# Patient Record
Sex: Female | Born: 1935 | Race: Black or African American | Hispanic: No
Health system: Southern US, Community
[De-identification: ages and names within clinical notes are randomized; demographics above are authoritative.]

## PROBLEM LIST (undated history)

## (undated) DIAGNOSIS — I6990 Unspecified sequelae of unspecified cerebrovascular disease: Secondary | ICD-10-CM

## (undated) DIAGNOSIS — E785 Hyperlipidemia, unspecified: Secondary | ICD-10-CM

## (undated) DIAGNOSIS — H3530 Unspecified macular degeneration: Secondary | ICD-10-CM

## (undated) DIAGNOSIS — M129 Arthropathy, unspecified: Secondary | ICD-10-CM

## (undated) DIAGNOSIS — I10 Essential (primary) hypertension: Secondary | ICD-10-CM

## (undated) DIAGNOSIS — F419 Anxiety disorder, unspecified: Secondary | ICD-10-CM

## (undated) DIAGNOSIS — F322 Major depressive disorder, single episode, severe without psychotic features: Secondary | ICD-10-CM

## (undated) DIAGNOSIS — G309 Alzheimer's disease, unspecified: Secondary | ICD-10-CM

## (undated) DIAGNOSIS — F0280 Dementia in other diseases classified elsewhere without behavioral disturbance: Secondary | ICD-10-CM

---

## 2012-02-05 ENCOUNTER — Inpatient Hospital Stay: Payer: Self-pay | Admitting: Internal Medicine

## 2012-02-05 LAB — COMPREHENSIVE METABOLIC PANEL
Albumin: 3.1 g/dL — ABNORMAL LOW (ref 3.4–5.0)
Alkaline Phosphatase: 126 U/L (ref 50–136)
Anion Gap: 7 (ref 7–16)
BUN: 35 mg/dL — ABNORMAL HIGH (ref 7–18)
Chloride: 111 mmol/L — ABNORMAL HIGH (ref 98–107)
Co2: 29 mmol/L (ref 21–32)
Creatinine: 1.51 mg/dL — ABNORMAL HIGH (ref 0.60–1.30)
Glucose: 94 mg/dL (ref 65–99)
Osmolality: 300 (ref 275–301)
SGPT (ALT): 37 U/L
Sodium: 147 mmol/L — ABNORMAL HIGH (ref 136–145)

## 2012-02-05 LAB — URINALYSIS, COMPLETE
Glucose,UR: NEGATIVE mg/dL (ref 0–75)
Ketone: NEGATIVE
Nitrite: NEGATIVE
RBC,UR: 2 /HPF (ref 0–5)
Squamous Epithelial: 5
WBC UR: 44 /HPF (ref 0–5)

## 2012-02-05 LAB — CBC
HCT: 34.8 % — ABNORMAL LOW (ref 35.0–47.0)
MCHC: 32.7 g/dL (ref 32.0–36.0)
MCV: 103 fL — ABNORMAL HIGH (ref 80–100)
Platelet: 187 10*3/uL (ref 150–440)
RBC: 3.38 10*6/uL — ABNORMAL LOW (ref 3.80–5.20)
WBC: 4.1 10*3/uL (ref 3.6–11.0)

## 2012-02-05 LAB — TROPONIN I: Troponin-I: <0.02 ng/mL

## 2012-02-05 LAB — CK TOTAL AND CKMB (NOT AT ARMC)
CK, Total: 43 U/L (ref 21–215)
CK-MB: 0.9 ng/mL (ref 0.5–3.6)

## 2012-02-06 LAB — MAGNESIUM: Magnesium: 2.1 mg/dL

## 2012-02-06 LAB — BASIC METABOLIC PANEL
Anion Gap: 9 (ref 7–16)
BUN: 25 mg/dL — ABNORMAL HIGH (ref 7–18)
Calcium, Total: 8.3 mg/dL — ABNORMAL LOW (ref 8.5–10.1)
Chloride: 112 mmol/L — ABNORMAL HIGH (ref 98–107)
Creatinine: 0.95 mg/dL (ref 0.60–1.30)
EGFR (African American): >60
EGFR (Non-African Amer.): 52 — ABNORMAL LOW
Glucose: 78 mg/dL (ref 65–99)
Potassium: 3.6 mmol/L (ref 3.5–5.1)
Sodium: 146 mmol/L — ABNORMAL HIGH (ref 136–145)

## 2012-02-06 LAB — LIPID PANEL
Ldl Cholesterol, Calc: 96 mg/dL (ref 0–100)
VLDL Cholesterol, Calc: 23 mg/dL (ref 5–40)

## 2012-02-07 LAB — URINE CULTURE

## 2012-02-11 LAB — CULTURE, BLOOD (SINGLE)

## 2012-04-22 ENCOUNTER — Inpatient Hospital Stay: Payer: Self-pay | Admitting: Internal Medicine

## 2012-04-22 LAB — PROTIME-INR
INR: 1
Prothrombin Time: 13.9 secs (ref 11.5–14.7)

## 2012-04-22 LAB — URINALYSIS, COMPLETE
Glucose,UR: NEGATIVE mg/dL (ref 0–75)
Ketone: NEGATIVE
Leukocyte Esterase: NEGATIVE
Nitrite: NEGATIVE
Ph: 5 (ref 4.5–8.0)
RBC,UR: 1 /HPF (ref 0–5)
Squamous Epithelial: 1
WBC UR: 1 /HPF (ref 0–5)

## 2012-04-22 LAB — CBC
HCT: 28.7 % — ABNORMAL LOW (ref 35.0–47.0)
HGB: 9.7 g/dL — ABNORMAL LOW (ref 12.0–16.0)
MCH: 34.6 pg — ABNORMAL HIGH (ref 26.0–34.0)
Platelet: 136 10*3/uL — ABNORMAL LOW (ref 150–440)
RDW: 13.3 % (ref 11.5–14.5)
WBC: 10.1 10*3/uL (ref 3.6–11.0)

## 2012-04-22 LAB — COMPREHENSIVE METABOLIC PANEL
Albumin: 2.7 g/dL — ABNORMAL LOW (ref 3.4–5.0)
Alkaline Phosphatase: 97 U/L (ref 50–136)
BUN: 17 mg/dL (ref 7–18)
Calcium, Total: 7.2 mg/dL — ABNORMAL LOW (ref 8.5–10.1)
Co2: 23 mmol/L (ref 21–32)
EGFR (African American): >60
EGFR (Non-African Amer.): 54 — ABNORMAL LOW
Glucose: 108 mg/dL — ABNORMAL HIGH (ref 65–99)
Potassium: 2.9 mmol/L — ABNORMAL LOW (ref 3.5–5.1)
SGOT(AST): 17 U/L (ref 15–37)
SGPT (ALT): 20 U/L (ref 12–78)

## 2012-04-22 LAB — TROPONIN I: Troponin-I: <0.02 ng/mL

## 2012-04-22 LAB — APTT: Activated PTT: 42.2 secs — ABNORMAL HIGH (ref 23.6–35.9)

## 2012-04-23 LAB — COMPREHENSIVE METABOLIC PANEL
Albumin: 2.7 g/dL — ABNORMAL LOW (ref 3.4–5.0)
Anion Gap: 6 — ABNORMAL LOW (ref 7–16)
BUN: 20 mg/dL — ABNORMAL HIGH (ref 7–18)
Bilirubin,Total: 0.4 mg/dL (ref 0.2–1.0)
Chloride: 118 mmol/L — ABNORMAL HIGH (ref 98–107)
Creatinine: 1.17 mg/dL (ref 0.60–1.30)
EGFR (African American): 47 — ABNORMAL LOW
Glucose: 109 mg/dL — ABNORMAL HIGH (ref 65–99)
Potassium: 4.4 mmol/L (ref 3.5–5.1)
SGOT(AST): 49 U/L — ABNORMAL HIGH (ref 15–37)
SGPT (ALT): 28 U/L (ref 12–78)
Sodium: 150 mmol/L — ABNORMAL HIGH (ref 136–145)
Total Protein: 5 g/dL — ABNORMAL LOW (ref 6.4–8.2)

## 2012-04-23 LAB — CBC WITH DIFFERENTIAL/PLATELET
Eosinophil #: 0 10*3/uL (ref 0.0–0.7)
Eosinophil %: 0.2 %
HCT: 26.1 % — ABNORMAL LOW (ref 35.0–47.0)
Lymphocyte #: 1.3 10*3/uL (ref 1.0–3.6)
MCH: 34.7 pg — ABNORMAL HIGH (ref 26.0–34.0)
MCV: 102 fL — ABNORMAL HIGH (ref 80–100)
Monocyte #: 1.2 x10 3/mm — ABNORMAL HIGH (ref 0.2–0.9)
Neutrophil #: 6.4 10*3/uL (ref 1.4–6.5)
Platelet: 129 10*3/uL — ABNORMAL LOW (ref 150–440)
RBC: 2.56 10*6/uL — ABNORMAL LOW (ref 3.80–5.20)
RDW: 13.8 % (ref 11.5–14.5)
WBC: 8.9 10*3/uL (ref 3.6–11.0)

## 2012-04-24 LAB — BASIC METABOLIC PANEL
Anion Gap: 8 (ref 7–16)
BUN: 12 mg/dL (ref 7–18)
Creatinine: 0.85 mg/dL (ref 0.60–1.30)
EGFR (African American): >60
EGFR (Non-African Amer.): 60 — ABNORMAL LOW
Sodium: 144 mmol/L (ref 136–145)

## 2012-04-24 LAB — CBC WITH DIFFERENTIAL/PLATELET
Basophil %: 0.4 %
Eosinophil #: 0.1 10*3/uL (ref 0.0–0.7)
Eosinophil %: 1.4 %
HCT: 26.2 % — ABNORMAL LOW (ref 35.0–47.0)
HGB: 8.8 g/dL — ABNORMAL LOW (ref 12.0–16.0)
Lymphocyte %: 14.5 %
MCV: 102 fL — ABNORMAL HIGH (ref 80–100)
Monocyte %: 13.1 %
Neutrophil #: 5.3 10*3/uL (ref 1.4–6.5)
Neutrophil %: 70.6 %
Platelet: 123 10*3/uL — ABNORMAL LOW (ref 150–440)
RBC: 2.57 10*6/uL — ABNORMAL LOW (ref 3.80–5.20)
WBC: 7.5 10*3/uL (ref 3.6–11.0)

## 2012-05-04 ENCOUNTER — Emergency Department: Payer: Self-pay | Admitting: *Deleted

## 2013-05-27 ENCOUNTER — Emergency Department: Payer: Self-pay | Admitting: Emergency Medicine

## 2014-01-17 ENCOUNTER — Emergency Department: Payer: Self-pay | Admitting: Emergency Medicine

## 2014-01-17 LAB — CBC WITH DIFFERENTIAL/PLATELET
BASOS ABS: 0.1 10*3/uL (ref 0.0–0.1)
Basophil %: 1 %
EOS PCT: 1.4 %
Eosinophil #: 0.1 10*3/uL (ref 0.0–0.7)
HCT: 38.6 % (ref 35.0–47.0)
HGB: 12.5 g/dL (ref 12.0–16.0)
Lymphocyte #: 1.5 10*3/uL (ref 1.0–3.6)
Lymphocyte %: 27.5 %
MCH: 33.3 pg (ref 26.0–34.0)
MCHC: 32.3 g/dL (ref 32.0–36.0)
MCV: 103 fL — AB (ref 80–100)
MONOS PCT: 12.3 %
Monocyte #: 0.7 x10 3/mm (ref 0.2–0.9)
Neutrophil #: 3.2 10*3/uL (ref 1.4–6.5)
Neutrophil %: 57.8 %
PLATELETS: 186 10*3/uL (ref 150–440)
RBC: 3.74 10*6/uL — AB (ref 3.80–5.20)
RDW: 13.9 % (ref 11.5–14.5)
WBC: 5.6 10*3/uL (ref 3.6–11.0)

## 2014-01-18 LAB — COMPREHENSIVE METABOLIC PANEL
ALK PHOS: 92 U/L
AST: 31 U/L (ref 15–37)
Albumin: 3.5 g/dL (ref 3.4–5.0)
Anion Gap: 5 — ABNORMAL LOW (ref 7–16)
BUN: 16 mg/dL (ref 7–18)
Bilirubin,Total: 0.3 mg/dL (ref 0.2–1.0)
CALCIUM: 8.8 mg/dL (ref 8.5–10.1)
CHLORIDE: 107 mmol/L (ref 98–107)
CREATININE: 0.97 mg/dL (ref 0.60–1.30)
Co2: 27 mmol/L (ref 21–32)
GFR CALC AF AMER: 58 — AB
GFR CALC NON AF AMER: 50 — AB
Glucose: 107 mg/dL — ABNORMAL HIGH (ref 65–99)
OSMOLALITY: 279 (ref 275–301)
Potassium: 3.9 mmol/L (ref 3.5–5.1)
SGPT (ALT): 19 U/L (ref 12–78)
Sodium: 139 mmol/L (ref 136–145)
Total Protein: 6.7 g/dL (ref 6.4–8.2)

## 2014-01-18 LAB — URINALYSIS, COMPLETE
BACTERIA: NONE SEEN
Bilirubin,UR: NEGATIVE
Glucose,UR: NEGATIVE mg/dL (ref 0–75)
Ketone: NEGATIVE
Nitrite: NEGATIVE
Ph: 7 (ref 4.5–8.0)
Protein: NEGATIVE
Specific Gravity: 1.011 (ref 1.003–1.030)
Squamous Epithelial: <1
WBC UR: 51 /HPF (ref 0–5)

## 2014-01-18 LAB — TROPONIN I

## 2014-01-18 LAB — TSH: Thyroid Stimulating Horm: 2.74 u[IU]/mL

## 2014-01-22 ENCOUNTER — Observation Stay: Payer: Self-pay | Admitting: Internal Medicine

## 2014-01-22 LAB — COMPREHENSIVE METABOLIC PANEL
ALBUMIN: 3.1 g/dL — AB (ref 3.4–5.0)
ALT: 15 U/L (ref 12–78)
AST: 17 U/L (ref 15–37)
Alkaline Phosphatase: 89 U/L
Anion Gap: 5 — ABNORMAL LOW (ref 7–16)
BUN: 15 mg/dL (ref 7–18)
Bilirubin,Total: 0.2 mg/dL (ref 0.2–1.0)
CALCIUM: 8.5 mg/dL (ref 8.5–10.1)
CHLORIDE: 105 mmol/L (ref 98–107)
CO2: 28 mmol/L (ref 21–32)
Creatinine: 1.13 mg/dL (ref 0.60–1.30)
EGFR (African American): 49 — ABNORMAL LOW
GFR CALC NON AF AMER: 42 — AB
GLUCOSE: 81 mg/dL (ref 65–99)
OSMOLALITY: 276 (ref 275–301)
Potassium: 4.1 mmol/L (ref 3.5–5.1)
Sodium: 138 mmol/L (ref 136–145)
Total Protein: 5.9 g/dL — ABNORMAL LOW (ref 6.4–8.2)

## 2014-01-22 LAB — URINALYSIS, COMPLETE
Bacteria: NONE SEEN
Bilirubin,UR: NEGATIVE
Blood: NEGATIVE
Glucose,UR: NEGATIVE mg/dL (ref 0–75)
Hyaline Cast: 26
Ketone: NEGATIVE
LEUKOCYTE ESTERASE: NEGATIVE
Nitrite: NEGATIVE
PH: 6 (ref 4.5–8.0)
Protein: NEGATIVE
RBC,UR: 1 /HPF (ref 0–5)
SPECIFIC GRAVITY: 1.019 (ref 1.003–1.030)
Squamous Epithelial: <1

## 2014-01-22 LAB — CBC
HCT: 34.8 % — ABNORMAL LOW (ref 35.0–47.0)
HGB: 11.4 g/dL — ABNORMAL LOW (ref 12.0–16.0)
MCH: 33.3 pg (ref 26.0–34.0)
MCHC: 32.6 g/dL (ref 32.0–36.0)
MCV: 102 fL — ABNORMAL HIGH (ref 80–100)
Platelet: 160 10*3/uL (ref 150–440)
RBC: 3.41 10*6/uL — ABNORMAL LOW (ref 3.80–5.20)
RDW: 13.8 % (ref 11.5–14.5)
WBC: 2.8 10*3/uL — AB (ref 3.6–11.0)

## 2014-01-22 LAB — CK-MB: CK-MB: 1.2 ng/mL (ref 0.5–3.6)

## 2014-01-22 LAB — TROPONIN I: Troponin-I: <0.02 ng/mL

## 2014-01-22 LAB — PROTIME-INR
INR: 1.1
Prothrombin Time: 13.7 secs (ref 11.5–14.7)

## 2014-01-23 LAB — MAGNESIUM: Magnesium: 2.1 mg/dL

## 2014-01-23 LAB — CBC WITH DIFFERENTIAL/PLATELET
BASOS PCT: 1.2 %
Basophil #: 0 10*3/uL (ref 0.0–0.1)
EOS PCT: 3 %
Eosinophil #: 0.1 10*3/uL (ref 0.0–0.7)
HCT: 33.4 % — ABNORMAL LOW (ref 35.0–47.0)
HGB: 10.9 g/dL — ABNORMAL LOW (ref 12.0–16.0)
Lymphocyte #: 1.2 10*3/uL (ref 1.0–3.6)
Lymphocyte %: 34.4 %
MCH: 33.3 pg (ref 26.0–34.0)
MCHC: 32.6 g/dL (ref 32.0–36.0)
MCV: 102 fL — ABNORMAL HIGH (ref 80–100)
Monocyte #: 0.6 x10 3/mm (ref 0.2–0.9)
Monocyte %: 16.3 %
Neutrophil #: 1.5 10*3/uL (ref 1.4–6.5)
Neutrophil %: 45.1 %
Platelet: 163 10*3/uL (ref 150–440)
RBC: 3.27 10*6/uL — ABNORMAL LOW (ref 3.80–5.20)
RDW: 13.6 % (ref 11.5–14.5)
WBC: 3.4 10*3/uL — AB (ref 3.6–11.0)

## 2014-01-23 LAB — BASIC METABOLIC PANEL
ANION GAP: 3 — AB (ref 7–16)
BUN: 10 mg/dL (ref 7–18)
CALCIUM: 8.3 mg/dL — AB (ref 8.5–10.1)
CHLORIDE: 113 mmol/L — AB (ref 98–107)
Co2: 25 mmol/L (ref 21–32)
Creatinine: 1.06 mg/dL (ref 0.60–1.30)
EGFR (African American): 52 — ABNORMAL LOW
EGFR (Non-African Amer.): 45 — ABNORMAL LOW
Glucose: 75 mg/dL (ref 65–99)
OSMOLALITY: 279 (ref 275–301)
Potassium: 4.2 mmol/L (ref 3.5–5.1)
SODIUM: 141 mmol/L (ref 136–145)

## 2014-01-23 LAB — CK-MB
CK-MB: 1.1 ng/mL (ref 0.5–3.6)
CK-MB: 1.5 ng/mL (ref 0.5–3.6)

## 2014-01-23 LAB — TROPONIN I
Troponin-I: <0.02 ng/mL
Troponin-I: <0.02 ng/mL

## 2014-01-23 LAB — LIPID PANEL
CHOLESTEROL: 196 mg/dL (ref 0–200)
HDL Cholesterol: 75 mg/dL — ABNORMAL HIGH (ref 40–60)
Ldl Cholesterol, Calc: 115 mg/dL — ABNORMAL HIGH (ref 0–100)
TRIGLYCERIDES: 28 mg/dL (ref 0–200)
VLDL Cholesterol, Calc: 6 mg/dL (ref 5–40)

## 2014-01-28 LAB — URINALYSIS, COMPLETE
BLOOD: NEGATIVE
Bilirubin,UR: NEGATIVE
Glucose,UR: NEGATIVE mg/dL (ref 0–75)
KETONE: NEGATIVE
Leukocyte Esterase: NEGATIVE
NITRITE: NEGATIVE
Ph: 5 (ref 4.5–8.0)
Protein: NEGATIVE
RBC,UR: <1 /HPF (ref 0–5)
Specific Gravity: 1.016 (ref 1.003–1.030)
WBC UR: 2 /HPF (ref 0–5)

## 2014-01-29 ENCOUNTER — Observation Stay: Payer: Self-pay | Admitting: Internal Medicine

## 2014-01-29 LAB — CBC WITH DIFFERENTIAL/PLATELET
BASOS ABS: 0 10*3/uL (ref 0.0–0.1)
BASOS PCT: 0.6 %
EOS PCT: 1.2 %
Eosinophil #: 0 10*3/uL (ref 0.0–0.7)
HCT: 36.2 % (ref 35.0–47.0)
HGB: 11.9 g/dL — ABNORMAL LOW (ref 12.0–16.0)
Lymphocyte #: 0.8 10*3/uL — ABNORMAL LOW (ref 1.0–3.6)
Lymphocyte %: 21.2 %
MCH: 33.3 pg (ref 26.0–34.0)
MCHC: 32.8 g/dL (ref 32.0–36.0)
MCV: 102 fL — ABNORMAL HIGH (ref 80–100)
MONOS PCT: 11.4 %
Monocyte #: 0.4 x10 3/mm (ref 0.2–0.9)
NEUTROS ABS: 2.6 10*3/uL (ref 1.4–6.5)
NEUTROS PCT: 65.6 %
Platelet: 189 10*3/uL (ref 150–440)
RBC: 3.56 10*6/uL — ABNORMAL LOW (ref 3.80–5.20)
RDW: 13 % (ref 11.5–14.5)
WBC: 3.9 10*3/uL (ref 3.6–11.0)

## 2014-01-29 LAB — COMPREHENSIVE METABOLIC PANEL
ALK PHOS: 85 U/L
ALT: 22 U/L (ref 12–78)
Albumin: 3.3 g/dL — ABNORMAL LOW (ref 3.4–5.0)
Anion Gap: 4 — ABNORMAL LOW (ref 7–16)
BILIRUBIN TOTAL: 0.3 mg/dL (ref 0.2–1.0)
BUN: 22 mg/dL — ABNORMAL HIGH (ref 7–18)
Calcium, Total: 9 mg/dL (ref 8.5–10.1)
Chloride: 108 mmol/L — ABNORMAL HIGH (ref 98–107)
Co2: 27 mmol/L (ref 21–32)
Creatinine: 1.17 mg/dL (ref 0.60–1.30)
EGFR (Non-African Amer.): 40 — ABNORMAL LOW
GFR CALC AF AMER: 47 — AB
GLUCOSE: 87 mg/dL (ref 65–99)
Osmolality: 280 (ref 275–301)
POTASSIUM: 4.6 mmol/L (ref 3.5–5.1)
SGOT(AST): 27 U/L (ref 15–37)
SODIUM: 139 mmol/L (ref 136–145)
TOTAL PROTEIN: 6.3 g/dL — AB (ref 6.4–8.2)

## 2014-01-29 LAB — TROPONIN I: Troponin-I: <0.02 ng/mL

## 2014-04-01 ENCOUNTER — Emergency Department: Payer: Self-pay | Admitting: Emergency Medicine

## 2014-04-01 LAB — CBC WITH DIFFERENTIAL/PLATELET
BASOS ABS: 0 10*3/uL (ref 0.0–0.1)
Basophil %: 0.6 %
EOS PCT: 5.1 %
Eosinophil #: 0.2 10*3/uL (ref 0.0–0.7)
HCT: 36.9 % (ref 35.0–47.0)
HGB: 12.5 g/dL (ref 12.0–16.0)
Lymphocyte #: 1.6 10*3/uL (ref 1.0–3.6)
Lymphocyte %: 41.3 %
MCH: 34.3 pg — AB (ref 26.0–34.0)
MCHC: 33.8 g/dL (ref 32.0–36.0)
MCV: 102 fL — ABNORMAL HIGH (ref 80–100)
Monocyte #: 0.6 x10 3/mm (ref 0.2–0.9)
Monocyte %: 14.5 %
Neutrophil #: 1.5 10*3/uL (ref 1.4–6.5)
Neutrophil %: 38.5 %
Platelet: 187 10*3/uL (ref 150–440)
RBC: 3.64 10*6/uL — AB (ref 3.80–5.20)
RDW: 14.3 % (ref 11.5–14.5)
WBC: 3.8 10*3/uL (ref 3.6–11.0)

## 2014-04-01 LAB — BASIC METABOLIC PANEL
Anion Gap: 6 — ABNORMAL LOW (ref 7–16)
BUN: 13 mg/dL (ref 7–18)
CALCIUM: 8.8 mg/dL (ref 8.5–10.1)
CHLORIDE: 104 mmol/L (ref 98–107)
CO2: 28 mmol/L (ref 21–32)
Creatinine: 0.8 mg/dL (ref 0.60–1.30)
EGFR (African American): >60
Glucose: 83 mg/dL (ref 65–99)
Osmolality: 275 (ref 275–301)
POTASSIUM: 4 mmol/L (ref 3.5–5.1)
Sodium: 138 mmol/L (ref 136–145)

## 2014-04-10 ENCOUNTER — Emergency Department: Payer: Self-pay | Admitting: Emergency Medicine

## 2014-10-19 ENCOUNTER — Ambulatory Visit: Payer: Self-pay | Admitting: Nurse Practitioner

## 2014-12-12 NOTE — Consult Note (Signed)
Brief Consult Note: Diagnosis: Right humeral shaft fracture.   Patient was seen by consultant.   Recommend further assessment or treatment.   Discussed with Attending MD.   Comments: Discussed case with Dr. Sherryll BurgerShah.  Patient is a 81 y/o female with Alzheimer's dementia who fell at her nursing home. Patient had pain and deformity of her arm after the fall and was diagnosed with a displaced midshaft fracture on her plain x-rays in the ER.  Patient is currently in a sling and her arm is ACE wrapped with a splint.  Patient is unable to provide a history because of demenita.  There is no family at the bedside currently.  Patient is trying to get out of bed.  On exam, patient has no bleeding on the dressing and the Dr. Clemens Catholicagsdale in the ER did not report an open wound.  She has intact sesation to light touch and is flexing and extending her digits.  There is no obvious wrist drop.  Patient's digits are well perfused and she has a palpable radial pulse.  On 2 views of the humerus, the patient has a long oblique fracture of the humeral shaft with lateral displacedment of the distal fragment.  There is rotation at the fracture, but no significant angulation.  I am recommending the patient be put into a humeral fracture brace.  She should wear this at all times.  I will see her back in my office in 1-2 weeks for follow-up.  Given her age, dementia and the overall alignment of the fracture, surgery is not indicated at this time, but she will require close follow-up.  Electronic Signatures: Juanell FairlyKrasinski, Koltyn Kelsay (MD)  (Signed 29-Aug-13 14:25)  Authored: Brief Consult Note   Last Updated: 29-Aug-13 14:25 by Juanell FairlyKrasinski, Jaaziah Schulke (MD)

## 2014-12-12 NOTE — Discharge Summary (Signed)
PATIENT NAME:  Erika BusmanHOMPSON, Isaly MR#:  [id number] DATE OF BIRTH:  [birth year]  DATE OF ADMISSION:  04/22/2012 DATE OF DISCHARGE:  04/24/2012  ADMITTING DIAGNOSIS: Status post fall, pain in the right arm.   DISCHARGE DIAGNOSES:  1. Status post fall, likely due to mechanical fall.  2. Right-sided oblique fracture in the mid to proximal right humerus. Status post orthopedics evaluation, a brace placed.  3. Hypotension. The patient's antihypertensives discontinued. Will likely need to be resumed per primary physician.  4. Hypernatremia, likely due to dehydration and improved with intravenous hydration.  5. Alzheimer's dementia.  6. Hyperlipidemia.  7. Depression.  8. Chronic osteoarthritis.  9. Anemia, likely anemia of chronic disease   PERTINENT LABORATORY AND EVALUATIONS: Admitting sodium 149, potassium 2.9, chloride 116, CO2 23, BUN 17, creatinine 0.92, blood glucose 108. LFTs were normal. CPK 291, CK-MB 3.7. Troponin less than 0.02. CBC showed WBC of 10.1, hemoglobin 9.7, platelet count 136. Urinalysis was negative. CT scan of the cervical spine without contrast showed no cervical fracture. Anterior leaflets of C3-C4, C4-C5. CT maxillofacial showed no fracture. CT scan the head showed no acute abnormality. Right hip x-ray showed no fracture. Right humerus x-ray showed fracture extending into the proximal left humerus at the base of the humeral head. Most recent BMP today: Glucose 124, BUN 12, creatinine 0.85, sodium 144, potassium 3.7, chloride 113, calcium 8.1. WBC 7.5, hemoglobin 8.8, platelet count 123.   HOSPITAL COURSE: Please refer to history and physical done by the admitting physician. The patient is a 81 year old female with known history of Alzheimer's dementia who lives [facility] [place]. The patient was admitted after a fall. She was seen in the ED. She was noted to have significant electrolyte imbalances and x-ray showed a right-sided displaced midshaft fracture of the humerus.  The patient was seen by orthopedics for the fracture and was placed on an arm brace. She was continued on IV fluids for electrolyte imbalances with resolution of her hypernatremia. The patient currently is close to her baseline and is stable for discharge.   DISCHARGE MEDICATIONS:  1. Citalopram 40 daily.  2. Lorazepam 1 mg once daily at 12:00 p.m.  3. Aggrenox 1 tab p.o. b.i.d.  4. Bromfenac 0.09% ophthalmic solution one drop to right eye 2 times a day. 5. Diocto 10 milliliters 2 times per day.  6. Namenda 10 daily.  7. Nuedexta 20/10, one tab p.o. b.i.d.  8. Quetiapine 25 mg 1 tab p.o. b.i.d.  9. Atorvastatin 20 mg at bedtime.  10. Mirtazapine 45 mg at bedtime.  11. Deep Sea nasal spray two sprays q.6 hours. 12. Tylenol 650 q.6 p.r.n.  13. Quetiapine 50 mg at bedtime.   14. Tramadol 50 mg one tab p.o. at bedtime.   DIET: Low sodium diet.   DIET CONSISTENCY: Dysphagia, soft.   ACTIVITY: As tolerated. Right arm fracture brace must be present at all times. Sling for patient comfort. Can remove if needed.   FOLLOWUP: Follow-up with Dr. Martha ClanKrasinski, orthopedics, in 1 to 2 weeks.   TIME SPENT: 35 minutes.   ____________________________ Lacie ScottsShreyang H. Allena KatzPatel, MD shp:ap D: 04/24/2012 14:46:13 ET T: 04/24/2012 14:56:19 ET JOB#: [id number] cc: Jacquette Canales H. Allena KatzPatel, MD, <Dictator> Kathreen DevoidKevin L. Krasinski, MD  Charise CarwinSHREYANG H Parv Manthey MD ELECTRONICALLY SIGNED 04/25/2012 8:16

## 2014-12-12 NOTE — H&P (Signed)
PATIENT NAME:  Erika Cochran, Erika Cochran MR#:  [id number] DATE OF BIRTH:  03/26/1919  DATE OF ADMISSION:  04/22/2012  PRIMARY CARE PHYSICIAN: [facility]   REQUESTING PHYSICIAN: Dr. Clemens Catholic    CHIEF COMPLAINT: Fall and pain in her right arm.   HISTORY OF PRESENT ILLNESS: The patient is a 81 year old female with known history of Alzheimer's dementia who lives at [facility] [place]. She is being admitted status post fall. She was walking around the bathroom and lost her balance and fell. EMS was called and she was brought into the Emergency Department. While in the ED, she was found to have significant hypokalemia with a potassium of 2.9, her sodium is 149, and was found to have a right-sided displaced midshaft fracture of the humerus. When I evaluated the patient, she was trying to get out of the bed. She does have a sling applied on her right shoulder and her arm is Ace wrapped with a splint. The patient denies any other problem at this time except minimal pain on her right shoulder. Per ED physician and record, she did have some abrasion on her right knee and some bleeding from the mouth which was stopped. She also had minimal swelling around her upper lip and laceration inside the lip which has also stopped bleeding. She is being admitted for further evaluation and management as she seems quite dehydrated.   PAST MEDICAL HISTORY:  1. Hypertension.  2. Hyperlipidemia.  3. Arthritis.  4. Depression.  5. Alzheimer's dementia.   PAST SURGICAL HISTORY: None.   ALLERGIES: Penicillin, propoxyphene, and Bactrim.   SOCIAL HISTORY: She lives at [facility] [place]. No smoking or alcohol use.   FAMILY HISTORY: Positive for hypertension.   REVIEW OF SYSTEMS: Unable to be obtained secondary to severe dementia.   MEDICATIONS AT HOME:  1. Tylenol 650 mg p.o. every six hours as needed.  2. Aggrenox 1 tablet p.o. b.i.d.  3. Amlodipine/benazepril 5/20 one tablet p.o. b.i.d.  4. Lipitor  20 mg p.o. daily.  5. Bromfenac 0.09% ophthalmic solution one drop to right eye twice a day.  6. Citalopram 40 mg p.o. daily.  7. Clonidine 0.2 mg p.o. b.i.d.  8. Deep Sea Nasal Spray two sprays to each nostril every six hours as needed.  9. Diocto 10 mL p.o. b.i.d.   10. Lasix 20 mg p.o. every Sunday, Tuesday, Thursday, and Saturday; 40 mg every Monday, Wednesday, Friday.  11. Lorazepam 1 mg p.o. daily.  12. Mirtazapine 15 mg 3 tablets p.o. at bedtime.  13. Namenda 10 mg p.o. b.i.d. needed.  14. Nuedexta 1 tablet p.o. b.i.d.   15. Quetiapine 25 mg p.o. b.i.d.  16. Quetiapine 50 mg p.o. at bedtime.  17. Tramadol 50 mg p.o. at bedtime.   PHYSICAL EXAMINATION:   VITAL SIGNS: Temperature 98.5, heart rate 96 per minute, respirations 18 per minute, blood pressure 131/78 mmHg. She is saturating 95% on 2 liters oxygen via nasal cannula.   GENERAL: The patient is a 81 year old female lying in the bed but is trying to get out of the bed.   EYES: Pupils equal, round, reactive to light and accommodation. No scleral icterus. Extraocular muscles intact.   HEENT: On her head she has minimal swelling on her upper lip, laceration inside her lip. There is no bleeding. Front teeth seem to be somewhat loose. Head is normocephalic. Oropharynx and nasopharynx clear.   NECK: Supple. No jugular venous distention. No thyroid enlargement.    LUNGS: Clear to auscultation  bilaterally. No wheezes, rales, rhonchi, or crepitation.   CARDIOVASCULAR: S1, S2 normal. No murmurs, rubs, or gallop.   ABDOMEN: Soft, nontender, nondistended. Bowel sounds present. No organomegaly or mass.   EXTREMITIES: She has her right shoulder in a sling and arm is Ace wrapped with a splint. No cyanosis or clubbing. All other extremities within normal limits. No pedal edema.   NEUROLOGIC: Difficult to evaluate as the patient would not cooperate with exam but it seems nonfocal. Cranial nerves III to XII seem intact. Sensation intact.    PSYCHIATRIC: The patient is pleasantly confused but she is alert otherwise.   SKIN: No obvious rash, lesion, or ulcer. She does have minimal laceration on her lip and minimal abrasion on her right knee.   LABORATORY, DIAGNOSTIC, AND RADIOLOGICAL DATA: Sodium 149, potassium 2.9, chloride 116, CO2 23, BUN 17, creatinine 0.92, blood glucose 108. Normal liver function tests. CK 291. MB fraction 3.7. Troponin less than 0.02. CBC showed white count of 10.1, hemoglobin 9.7, hematocrit 28.7, platelets 136. PT 13.9. INR 1.0. PTT 42.2. Negative urinalysis.   CT scan of cervical spine without contrast on August 29th showed no cervical fracture. Mild anterolisthesis of C3 on C4 and C4 on C5 with possible degenerative joint disease.   CT maxillofacial area without contrast on August 29th showed no facial fracture.   CT scan of the head without contrast showed no acute intracranial process. Chronic small vessel ischemic disease.   Right hip x-ray showed no definite acute bony abnormality. Degenerative joint disease present.  Chest x-ray showed no acute cardiopulmonary disease.   Right humerus x-ray showed fracture extending into proximal left humerus at the base of the humeral head.  Right knee x-ray showed no acute bony abnormality.   Pelvic x-ray on August 29th showed no definite acute bony abnormality.   IMPRESSION AND PLAN:  1. Severe hypokalemia with potassium of 2.9. Will aggressively replete and recheck. Will also check magnesium level.  2. Hypernatremia likely due to severe dehydration. Will hydrate her with IV fluids and monitor her sodium.  3. Right-sided humerus fracture. She already has a sling and Ace wrapped. Will await Orthopedic consult. Will likely need a brace. Considering her age and multiple comorbidities, likely conservative management. Also, she is demented. Await Orthopedic input.  4. Alzheimer's dementia. This could potentially get worse with her acute event leading to  delirium. She is on all appropriate medications at home which will continue and monitor her closely.  5. Hypertension. Will continue her home medication.  6. Hyperlipidemia. Continue statin.  7. Depression. Continue Celexa.   TOTAL TIME TAKING CARE OF THIS PATIENT: 50 minutes.   ____________________________ Ellamae SiaVipul S. Sherryll BurgerShah, MD vss:drc D: 04/22/2012 16:27:11 ET T: 04/22/2012 16:51:22 ET JOB#: [id number]  cc: Ailanie Ruttan S. Sherryll BurgerShah, MD, <Dictator> Kathreen DevoidKevin L. Krasinski, MD [facility]  Ellamae SiaVIPUL S [facility] MD ELECTRONICALLY SIGNED 04/23/2012 [id number]

## 2014-12-16 NOTE — H&P (Signed)
PATIENT NAME:  Erika Cochran, Erika Cochran MR#:  [id number] DATE OF BIRTH:  [birth year]  DATE OF ADMISSION:  01/22/2014  REFERRING PHYSICIAN: Dr. Minna AntisKevin Paduchowski.  PRIMARY CARE PHYSICIAN: The patient has being following the physician at [facility], Dr. Carl BestShayne Ladak.  HISTORY OF PRESENT ILLNESS: This is a 81 year old female with a history of advanced dementia, ambulates at baseline with a walker, who lives in [facility]. The patient presents with episode of unresponsiveness. The patient was being visited by her granddaughter when all of a sudden she slumped to the right side, remained unresponsive for 10 to 15 minutes. Granddaughter reports she notices some slurred speech, noticed some right facial droop and some right hand twitching. By the time patient presented to ED, she was still minimally responsive, unresponsive to many painful stimuli but she did have good hand grips and had good resistance in her lower extremities. Facial droop could not be noticed by then but patient was nonverbal and could not evaluate for any slurred speech as she was severely confused and minimally responsive. The patient's CT head did not show any acute findings. The granddaughter reports the patient had a previous episode before 2 weeks where it was thought due to UTI for which she was treated for. As well, she reports she has noticed some coughing while she is eating and reports she has dysphagia where she is on pureed diet as well reports for a brief episode she pointed to her chest. Unclear if she was complaining of chest pain or not. CT head did not show any acute findings. Blood work did not show any significant abnormalities besides mild leukopenia at 2.8, her urinalysis was negative. Hospitalist service was requested to admit the patient for further evaluation.   PAST MEDICAL HISTORY:  1. Hyperlipidemia.  2. Anxiety disorder.  3.  depression with recurrent episodes.  4. Alzheimer's disease.  5. Macular degeneration.  6. Hypertension.  7. History of CVA in the past.   PAST SURGICAL HISTORY: None.   ALLERGIES: PENICILLIN, PROPOXYPHENE, AND BACTRIM.   SOCIAL HISTORY: [facility]. No history of smoking or alcohol use.   FAMILY HISTORY: Significant for hypertension.   REVIEW OF SYSTEMS: The patient is unable to provide any review of systems due to her mentation.   HOME MEDICATIONS:  1. Aggrenox 1 capsule 2 times a day.  2. Citalopram 20 mg oral daily.  3. Ensure 1 container 5 times a day.  4. Lorazepam 1 mg oral or sublingual at noon for anxiety. 5. Mirtazapine 15 mg at bedtime.  6. Namenda 10 mg 2 times a day.  7. Quetiapine 75 mg at bedtime.  8. Senna 2 times a day.  9. Sulfamethoxazole 2 times a day for 1 week which was started May 27th.  10. Tramadol 50 mg oral at bedtime.  11. P.r.n. Tylenol.  12. P.r.n. Milk of Magnesia. 13. P.r.n. Ativan 1 mg q. 6 p.r.n.   PHYSICAL EXAMINATION:  VITAL SIGNS: Temperature 97.8, pulse 72, respiratory rate 17, blood pressure 148/68, saturating 98% on room air.  GENERAL: Frail, elderly female, lays in bed, in no apparent distress.  HEENT: Head atraumatic, normocephalic. Her eyes closed, has to be opened. Pink conjunctivae. Anicteric sclerae. Dry oral mucosa.  NECK: Supple. No thyromegaly. No JVD.  CHEST: Good air entry bilaterally. No wheezing, rales, or rhonchi.  CARDIOVASCULAR: S1, S2 heard. No rubs, murmurs, gallops.  ABDOMEN:  Soft, nontender, nondistended. Bowel sounds present.  EXTREMITIES: No edema. No clubbing. No cyanosis. Pedal and radial pulses felt bilaterally.  PSYCHIATRIC: Patient is unresponsive. She just moans to sternal rub, unable to evaluate any further.  NEUROLOGIC: I am unable to evaluate appropriately but she is able to use both hands with good grip bilaterally. As well she has good resistance  on both of her extremities when I tried to bend her knees. Very hard to evaluate for any facial droop given she is edentulous.  SKIN: Warm and dry.  MUSCULOSKELETAL: No joint effusion or erythema.  LYMPHATIC: No cervical lymphadenopathy could be appreciated.   PERTINENT LABORATORIES: Glucose 81, BUN 15, creatinine 1.13, sodium 138, potassium 4.1, chloride 105, CO2 of 28. Troponin less than 0.02. White blood cell 2.8, hemoglobin 11.4, hematocrit 34.8, platelets 160,000. Urinalysis negative for leukocyte esterase and nitrites.   IMAGING: CT head without contrast showing no acute intracranial abnormalities. No change from previous study, age-related atrophy, old infarcts, and mild chronic microvascular ischemic changes.   ASSESSMENT AND PLAN:  1. Episode of unresponsiveness/altered mental status. Etiology is unclear. Differential here, there are multiple diagnoses on the differentials. Might be due to syncope, even though, I would have anticipated the patient to be back at her baseline by now as well possibly due to cerebrovascular accident as she had right facial droop and slurred speech noticed by the daughter versus seizures given the fact she had right hand twitching, so she will be admitted to telemetry unit for further workup. Will obtain MRI of the brain, we will obtain EEG and 2D echocardiogram and carotid Dopplers. We will monitor her on telemetry. Will have her on IV fluids. She will be kept n.p.o. We will give her 1 dose of aspirin per rectum, 300 mg. If there are any studies abnormal will consult neurology service.  2. History of dysphagia. Given patient's mentation, currently she will be kept n.p.o. Will have swallow evaluate her when patient's mentation improved to evaluate her for swallowing as the granddaughter reports coughing while eating.  3. Questionable episode of chest pain as the patient pointed to her chest as per granddaughter. We will monitor her on tele. Cycle her cardiac enzymes.  Will give her aspirin per rectum.  4. History of cerebrovascular accident in the past. The patient can be resumed back on her Aggrenox once she is more stable.  5. Hypertension. Blood pressure is acceptable without taking any medications.  6. History of depression and Alzheimer's dementia. The patient can be resumed back on her medications once she is more stable and able to tolerate p.o. intake.  7. Deep vein thrombosis prophylaxis. Subcutaneous heparin.   CODE STATUS: Discussed with the granddaughter. The patient has a LIVING WILL. The granddaughter, she is the healthcare power of attorney. The patient is DNR as per granddaughter as well she has a DNR form sent from the nursing home with her.  TIME SPENT ON ADMISSION AND PATIENT CARE: 55 minutes.    ____________________________ Starleen Arms, MD dse:lt D: 01/22/2014 22:31:00 ET T: 01/23/2014 00:37:23 ET JOB#: [id number]  cc: Starleen Arms, MD, <Dictator> Lorren Splawn Teena Irani MD ELECTRONICALLY SIGNED 02/04/2014 23:39

## 2014-12-16 NOTE — H&P (Signed)
PATIENT NAME:  Erika Cochran, Erika Cochran MR#:  [id number] DATE OF BIRTH:  [birth year]  DATE OF ADMISSION:  01/29/2014  REFERRING PHYSICIAN:  Dr. Dolores FrameSung.  PRIMARY CARE PHYSICIAN:  Dr. Alphonsus SiasLetvak.   CHIEF COMPLAINT:  Syncope.   HISTORY OF PRESENT ILLNESS:  A 81 year old African American female with past medical history of dementia, hyperlipidemia, macular degeneration as well as history of CVA presenting after a syncopal episode.  The patient is unable to provide any meaningful information given baseline mental status; however, per documentation of family at bedside noted that she finished dinner, then became unresponsive while she is in a sitting position.  She is now back to baseline per family member who is once again at bedside.  No further complaints.  She has had no symptoms over the last few days.  Denies any chest pain, palpitations, shortness of breath, or further symptomatology, though question the reliability of this information.  She was recently discharged from [facility] on 01/24/2014 with discharge diagnosis of encephalopathy.  REVIEW OF SYSTEMS:   Unable to obtain given the patient's baseline mental status.   PAST MEDICAL HISTORY:  Hyperlipidemia, anxiety, depression, not otherwise specified, dementia, macular degeneration, hypertension as well as history of CVA.   SOCIAL HISTORY:  Denies any alcohol, tobacco or drug usage.  Uses a walker for ambulation, currently resides at an Alzheimer's unit at [facility].   FAMILY HISTORY:  Hypertension.   ALLERGIES:  PENICILLIN, PROPOXYPHENE.  HOME MEDICATIONS:  Acetaminophen 325 2 tablets every six hours as needed for pain or fever, tramadol 50 mg by mouth at bedtime, milk of magnesia 30 mL as needed for constipation, lorazepam 0.5 mg by mouth q. 6 hours as needed for anxiety, lorazepam 1 mg by mouth daily, citalopram 20 mg by mouth daily, mirtazapine 50 mg by mouth at bedtime, Aggrenox 25/200 mg by mouth twice daily, quetiapine 25 mg 3  tablets by mouth at bedtime, senna S 50/8.6 mg by mouth twice daily, Namenda 10 mg by mouth twice daily, Ensure 5 times daily.   PHYSICAL EXAMINATION: VITAL SIGNS:  Temperature 97.5, heart rate 71, respirations 18, blood pressure 134/69, saturating 96% on room air.  Weight 68 kg, BMI 24.2.  GENERAL:  Chronically ill-appearing African American female in minimal distress secondary to mental status.  HEAD:  Normocephalic, atraumatic.  EYES:  Pupils equal, round, reactive to light.  Extraocular muscles intact.  No scleral icterus.  MOUTH:  Dry mucosal membrane.  Dentition poor.  No abscess noted.  EAR, NOSE, THROAT:  Clear without exudates.  No external lesions.  NECK:  Supple.  No thyromegaly.  No nodules.  No JVD.  PULMONARY:  Clear to auscultation bilaterally without wheezes, rubs, or rhonchi.  No use of accessory muscles.  Good respiratory effort. CHEST:  Nontender to palpation.  CARDIOVASCULAR:  S1, S2, regular rate and rhythm.  No murmurs, rubs, or gallops.  No edema.  Pedal pulses 2+ bilaterally.  GASTROINTESTINAL:  Soft, nontender, nondistended.  No masses.  Positive bowel sounds.  No hepatosplenomegaly.  MUSCULOSKELETAL:  No swelling, clubbing, or edema.  Pedal pulses 2+ bilaterally.  Range of motion full in all extremities.  NEUROLOGICAL:  Unable to fully assess given the patient's current mental status.  She does have spontaneous movements in all extremities.  Sensation intact.  Reflexes intact.  No gross neurological deficits.  SKIN:  No ulcerations, lesions, rash, cyanosis.  Skin warm, dry.  Turgor intact.  PSYCHIATRIC:  Mood and affected blunted.  She is awake, a little confused.  She is able to answer simple yes or no questions, however not oriented.  Her insight and judgment poor.   LABORATORY DATA:  EKG:  Normal sinus rhythm, heart rate 69.  No ST or T wave abnormalities.  No conduction deficits.  Chest x-ray performed, no acute cardiopulmonary process.  CT head performed, no acute  intracranial process.  Remainder of laboratory data:  Sodium 139, potassium 4.6, chloride 108, bicarb 27, BUN 22, creatinine 1.17, glucose 87.  LFTs:  Albumin 3.3, total protein 6.3, otherwise within normal limits.  WBC 3.9, hemoglobin 11.9, platelets of 189.   ASSESSMENT AND PLAN:  A 81 year old [place] American female with a past medical history of dementia, presenting with syncopal episode.  1.  Syncope, admit to telemetry under observational status.  We will check orthostatic vital signs, gentle intravenous fluid hydration.  2.  History of cerebrovascular accident.  Continue with Aggrenox.  3.  Dementia.  Continue with Namenda.  4.  Dysphagia.  We will place on pureed diet with thickened liquids.  5.  CODE STATUS:  THE PATIENT IS DO NOT RESUSCITATE as confirmed with family members at bedside.   TIME SPENT:  45 minutes.    ____________________________ Cletis Athens. Hower, MD dkh:ea D: 01/29/2014 03:23:29 ET T: 01/29/2014 04:00:03 ET JOB#: [id number]  cc: Cletis Athens. Hower, MD, <Dictator> DAVID Synetta Shadow MD ELECTRONICALLY SIGNED 01/30/2014 0:48

## 2014-12-16 NOTE — Discharge Summary (Signed)
PATIENT NAME:  Erika Cochran, Erika Cochran MR#:  [id number] DATE OF BIRTH:  17-Sep-1918  DATE OF ADMISSION:  01/29/2014 DATE OF DISCHARGE:  01/29/2014  ADMITTING PHYSICIAN: Dr. Clint GuyHower.  DISCHARGING PHYSICIAN: Dr. Enid Baasadhika Toshika Parrow.  PRIMARY PHYSICIAN: Dr. Alphonsus SiasLetvak.  CONSULTATIONS IN THE HOSPITAL: None.   DISCHARGE DIAGNOSES:  1. Syncope.  2. Advanced dementia.  3. Depression.  4. History of cerebrovascular accident.  DISCHARGE HOME MEDICATIONS: 1. Lorazepam 1 mg sublingual once a day at 12:00 p.m.  2. Aggrenox 1 capsule p.o. b.i.d.  3. Namenda 10 mg p.o. b.i.d.  4. Tramadol 50 mg p.o. at bedtime.  5. Senokot 1 tablet p.o. b.i.d.  6. Ativan 0.5 mg q. 6 hours p.r.n. for anxiety.  7. Milk of magnesia 30 mL daily p.r.n. for constipation.  8. Celexa 20 mg daily.  9. Remeron 15 mg as per depression. 10. Quetiapine 75 mg once a day at bedtime for psychosis.  11. Tylenol 650 mg q. 6 hours p.r.n. for pain or fever.  12. Ensure vanilla 1 can 5 times a day.   DISCHARGE DIET: Low-sodium diet, a pureed diet with nectar-thick liquids.  DISCHARGE ACTIVITY: As tolerated.   FOLLOWUP INSTRUCTIONS:  1. PCP followup in 2 to 3 days.  2. Resume physical therapy services. 3. If continues to be sleepy hold Remeron, Celexa, and quetiapine.   LABORATORIES AND IMAGING STUDIES PRIOR TO DISCHARGE: WBC 3.9, hemoglobin 11.9, hematocrit 36.2, platelet count of 189,000.   Sodium 139, potassium 4.6, chloride 108, bicarbonate 27, BUN 22, creatinine 1.17, glucose 87, and calcium of 9.0.   ALT 22, AST 20, alkaline phosphatase 85, total bilirubin 0.3, and albumin of 3.3.  Chest x-ray showing clear lung fields. No acute abnormality. CT of the head showing stable age-related cerebral atrophy, ventriculomegaly, and right periventricular white matter, ischemic changes, and remote infarcts seen. No acute intracranial abnormalities.   Urinalysis negative for any infection.   BRIEF HOSPITAL COURSE: Erika Cochran is a  81 year old elderly female with past medical history significant for advanced dementia, is from assisted living facility. Also has a history of macular degeneration and a history of CVA was brought in after a syncopal episode.  1. Syncope could be vasovagal in nature. Happened after she just finished her dinner; however, the patient has been having changes in mental status for which she was recently admitted to the hospital about a week ago and had a complete neurological workup done including Dopplers of her carotids and her also MRI which were normal at the time. The patient does have age-related cerebral atrophy and periventricular white matter ischemic changes. Granddaughter, who takes care of the patient's medical issues, was updated about advancing dementia and possible end of life stage II. The patient has been very sleepy and has been talking about Jesus and praying while in the hospital. She understands no acute medical issues and she is being discharged back to her assisted living facility. No changes were made to her medications. She will continue taking her Aggrenox for her history of stroke. If further mental status changes were to happen, advised to stop her psych medications including Celexa, Remeron, quetiapine at this time.  2. Dementia, advanced dementia, presently confused at baseline on Namenda. 3. Her course has been otherwise uneventful in the hospital.   DISCHARGE CONDITION: Guarded with poor long-term prognosis.   DISCHARGE DISPOSITION: Assisted living facility.   TIME SPENT ON DISCHARGE: 40 minutes.   CODE STATUS: Do not resuscitate.   ____________________________ Enid Baasadhika Tania Perrott, MD rk:lt D: 01/29/2014 12:17:25 ET  T: 01/29/2014 21:10:51 ET JOB#: [id number]  cc: Enid Baas, MD, <Dictator> Karie Schwalbe, MD Enid Baas MD ELECTRONICALLY SIGNED 02/04/2014 14:26

## 2014-12-16 NOTE — Discharge Summary (Signed)
PATIENT NAME:  Erika Cochran, Elaiza MR#:  [id number] DATE OF BIRTH:  05-21-1919  DATE OF ADMISSION:  01/22/2014 DATE OF DISCHARGE:  01/24/2014  PRESENTING COMPLAINT: Altered mental status.   DISCHARGE DIAGNOSES:  1.  Altered mental status/acute encephalopathy, resolved, etiology unclear.  2.  Advanced dementia.  3.  Macular degeneration.  CODE STATUS: No code, DNR.   MEDICATIONS AT DISCHARGE:  1.  Lorazepam 1 mg sublingual once a day at noon.  2.  Aggrenox 1 capsule b.i.d.  3.  Namenda 10 mg b.i.d.  4.  Tramadol 50 mg once a day at bedtime.  5.   S 1 tablet b.i.d.  6.  Lorazepam 0.5 mg 1 tablet every 8 hours as needed.  7.  Milk of magnesia 30 mL daily as needed.  8.  Citalopram 20 mg daily.  9.  Remeron 15 mg at bedtime.  10. Quetiapine 25 mg 3 tablets, which is 75 mg once a day at bedtime.  11. Tylenol 650 mg every 6 hours as needed.  12. Ensure 1 can 5 times a day.   Physical therapy.   DIET: Dysphagia 1 pureed with nectar thick liquids.   FO[facility] Home health PT has been set up.   LABS AND IMAGING: Ultrasound carotid mild amount of atherosclerotic plaque at both carotid bifurcations, but estimated bilateral ICA stenosis of less than 50%. MRI of the brain shows no acute intracranial abnormality and of the right parietal and right occipital related to remote ischemia. Echo Doppler showed EF of 60% to 65%, mild aortic regurgitation, mild to moderate aortic valve sclerosis, moderately increased left ventricular posterior wall thickness, mild mitral valve regurgitation and moderately increased left ventricular septal thickness. Hemoglobin and hematocrit 10.9 and 33.4. Basic metabolic panel within normal limits except chloride of 113. Lipid profile within normal limits except LDL of 115. Magnesium is 2.1. Cardiac enzymes x 3 negative.   IMPRESION:  1.  Erika BusmanJanet Cochran is a 81 year old African American female with history of advanced dementia  and macular degeneration, who came in with an episode of unresponsiveness, which was  altered mental status. Her etiology remains unclear. Her mentation was better. She received IV fluids for suspected mild dehydration. She was eating reasonably well. MRI was negative for CVA. No focal neuro deficits were noted. EEG was negative.  2.  History of dysphagia. Speech evaluation noted. Continued on dysphagia 1 pureed diet with nectar thick liquids.  3.  Advanced dementia. The patient remained somewhat pleasantly confused. Overall mentation improved.  4.  History of CVA in the past. Continued Aggrenox.  5.  Hypertension. Blood pressure is acceptable without taking any medications.  6.  Hospital stay otherwise remained stable. The patient was seen by physical therapy, who recommended rehab; however, granddaughter prefers the patient did get back to her assisted living facility and hence outpatient home health PT and been arranged. Hospital stay otherwise remained stable.   CODE STATUS: She remained a no code, DNR.   TIME SPENT: 40 minutes.  ____________________________ Wylie HailSona A. Allena KatzPatel, MD sap:aw D: 01/25/2014 07:14:45 ET T: 01/25/2014 07:25:40 ET JOB#: [id number]  cc: Nevayah Faust A. Allena KatzPatel, MD, <Dictator> DR. LADAK Willow OraSONA A Jeaneen Cala MD ELECTRONICALLY SIGNED 02/06/2014 14:15

## 2014-12-17 NOTE — H&P (Signed)
PATIENT NAME:  Erika Cochran, JUNCAJ MR#:  [id number] DATE OF BIRTH:  1919/03/23  DATE OF ADMISSION:  02/05/2012  PRIMARY CARE PHYSICIAN: None local.  REFERRING PHYSICIAN: Janalyn Harder, MD  CHIEF COMPLAINT: Fall today.   HISTORY OF PRESENT ILLNESS: The patient is 81 year old female with a history of hypertension, hyperlipidemia, and Alzheimer's dementia who was sent from her nursing home for fall today. The patient was noted to have imbalanced gait and fell in the nursing home. Today she was noticed to have a little bit of mental status changes, this morning. She was sent to the ED after the fall and was noted to have low blood pressure, in the 80s. She was treated with normal saline bolus and she was noted to have a urinary tract infection and was treated with Levaquin. The patient is demented and cannot provide any information and all information was obtained from her granddaughter who is the power or attorney. She said the patient also has had a cough for one week, possibly due to aspiration. In addition, the patient has generalized weakness and poor appetite recently.   PAST MEDICAL HISTORY:  1. Hypertension. 2. Hyperlipidemia. 3. Alzheimer's dementia.   PAST SURGICAL HISTORY: None.   SOCIAL HISTORY: No smoking or drinking or illicit drugs. Living in a nursing home recently, moved to this area.  FAMILY HISTORY: Hypertension.  REVIEW OF SYSTEMS: The patient is demented and unable to obtain.  DRUG ALLERGIES: Penicillin, propoxyphene, and Bactrim.   HOME MEDICATIONS:  1. Acetaminophen 325 mg tablets two tablets p.o. every 6 hours p.r.n.  2. Aggrenox 25 mg/200 mg capsule 1 capsule twice daily.  3. Amlodipine/benazepril 5 mg/20 mg p.o. twice a day. 4. Atorvastatin 20 mg p.o. at bedtime.  5. Bromfenac 0.09 ophthalmic solution one drop to both eyes twice a day.  6. Citalopram 40 mg p.o. daily.  7. Clonidine 0.2 mg 1 tablet p.o. twice a day. 8. Deep Sea nasal 0.65% nasal spray two sprays  every 6 hours p.r.n.  9. Diocto 10 mg/mL oral liquid 10 mL twice daily.  10. Lasix 20 mg p.o. once daily on Sunday, Tuesday, Thursday, and Saturday;  40 mg p.o. on Monday, Wednesday, and Friday. 11. Lorazepam 1 mg p.o. every four hours p.r.n. for anxiety.  12. Lorazepam 1 mg p.o. once daily.  13. Mirtazapine 50 mg three tablets p.o. at bedtime.  14. Namenda 10 mg p.o. twice a day. 15. Nuedexta 20 mg/10 mg p.o. every 12 hours.  16. Quetiapine 25 mg 1 tablet p.o. twice a day. 17. Quetiapine 50 mg tablets 2 tablets once daily at bedtime.  18. Robitussin 100 mg/5 mL oral 10 mL three times daily.   PHYSICAL EXAMINATION:   VITAL SIGNS: Temperature 95.3, blood pressure was 80/55 after normal saline bolus and increased to 106/71, pulse 64, and oxygen saturation 97% on room air.   GENERAL: The patient is awake but demented, in no acute distress.   HEENT: Pupils round, equal, and reactive to light and accommodation. Moist oral mucosa. Clear oropharynx.   NECK: Supple. No JVD. No carotid bruits. No lymphadenopathy. No thyromegaly.   CARDIOVASCULAR: S1 and S2 regular rate and rhythm. No murmurs or gallops.   PULMONARY: Bilateral air entry. No wheezing or rales. No use of accessory muscles to breathe.   ABDOMEN: Soft. No distention or tenderness. No organomegaly. Bowel sounds present.   EXTREMITIES: No edema, clubbing, or cyanosis. No calf tenderness.   SKIN: No rash or jaundice.   NEUROLOGIC: The patient is demented but  awake. No focal deficit. Deep tendon reflexes mute. Power 5/5. Sensation intact.  LABS/RADIOLOGIC STUDIES: Glucose 94. WBC 4.1, hemoglobin 11.4, and platelets 187. BUN 35, creatinine 1.51, sodium 147, potassium 4.2, chloride 111, and bicarbonate 29.   Urinalysis showed WBCs 44, RBC 2, nitrite negative.   EKG showed normal sinus rhythm at 61 beats per minute with prolonged QT.   Chest x-ray: No acute cardiopulmonary process.   IMPRESSION:  1. Hypotension possibly due to  dehydration and urinary tract infection. 2. Acute renal failure with dehydration.  3. Urinary tract infection.  4. Anemia.  5. Alzheimer's dementia.  6. Dysphagia.   PLAN OF TREATMENT:  1. The patient will be admitted to the telemetry floor. We will hold hypertension medication, give normal saline IV 100 mL/h, and follow-up BMP.  2. For urinary tract infection, we will continue Levaquin, follow up urine culture, CRP, and CBC.  3. For dysphagia, we will get swallowing study. 4. We will start aspiration precautions, fall precautions, and physical therapy.         I discussed the patient's situation and plan of treatment with the patient's granddaughter.   TIME SPENT: About 55 minutes. ____________________________ Shaune PollackQing Tenicia Gural, MD qc:slb D: 02/05/2012 14:49:53 ET T: 02/05/2012 15:43:41 ET JOB#: [id number]  cc: Shaune PollackQing Romilda Proby, MD, <Dictator> Shaune PollackQING Bev Drennen MD ELECTRONICALLY SIGNED 02/06/2012 19:35

## 2014-12-17 NOTE — Discharge Summary (Signed)
PATIENT NAME:  Erika Cochran, Erika Cochran MR#:  [id number] DATE OF BIRTH:  05-29-1919  DATE OF ADMISSION:  02/05/2012 DATE OF DISCHARGE:  02/06/2012  ADMISSION DIAGNOSES:  1. Acute renal failure and dehydration with hypotension.  2. Urinary tract infection.   DISCHARGE DIAGNOSES:  1. Acute renal failure and dehydration, resolved.  2. Urinary tract infection.  3. Confusion due to urinary tract infection.  4. Hypernatremia.  5. Dementia.   CONSULTATIONS: None.   LABORATORY DATA:  Discharge sodium 146, potassium 3.6, chloride 112, bicarbonate 25, BUN 25, creatinine 0.95, glucose 78, magnesium 2.1, LDL 96, VLDL 23, HDL 55, triglycerides 114, cholesterol is 174, TSH 2.08.   Chest x-ray showed no acute cardiopulmonary disease.   Urine culture shows no growth. Blood cultures no growth.   HOSPITAL COURSE:  81 year old female with dementia who presented with hypotension, confusion, and dehydration. For further details, please refer to the History and Physical.  1. Acute renal failure secondary to dehydration, resolved.  2. Confusion due to urinary tract infection, which is felt to be the cause of her acute renal failure and dehydration. Urine culture showed no growth. However, urinalysis was obvious for urinary tract infection. The patient is on Levaquin, which we will continue. Speech evaluation and physical therapy evaluation were performed.  3. Hypernatremia from dehydration.  4. Dementia. The patient will continue her outpatient medications.    DISCHARGE MEDICATIONS: 1. Lasix 40 mg Monday, Wednesday, Friday.  2. Lasix 20 mg Sunday, Tuesday, Thursday, Saturday.  3. Citalopram 40 mg daily.  4. Aggrenox 1 tablet b.i.d.  5. Norvasc benazepril 5/20 mg b.i.d.  6. Bromfenac 0.09% ophthalmic solution right eye b.i.d.  7. Clonidine 0.2 mg b.i.d.  8. Namenda 10 mg daily.  9. Diocto 10 mL twice a day. 10. Nudexta  20/10 q. 12 hours.  11. Quetiapine 25 mg 3 times daily.  12. Atorvastatin 20 mg daily.   13. Remeron 45 mg daily.  14. Quetiapine 50 mg 2 tablets at bedtime.  15. Tramadol 50 mg at bedtime.  16. Deep Sea Nasal 0.6% nasal spray, two sprays every six hours p.r.n. congestion.  17. Lorazepam 1 mg q. 4 hours p.r.n. anxiety.  18. Tylenol 325, 2 tablets every six hours as needed.  19. Ativan once a day at 12:00 p.m.  20. Levaquin 250 mg every 24 hours for seven days.   DISCHARGE DIET: Mechanical soft, Ensure plus t.i.d., aspiration precautions.   DISCHARGE ACTIVITY: As tolerated.   DISCHARGE FOLLOWUP: The patient can follow up with her primary care physician in two weeks.   The patient is medically stable for discharge.   ____________________________ Nishat Livingston P. Juliene PinaMody, MD spm:bjt D: 02/06/2012 12:40:20 ET T: 02/06/2012 13:02:59 ET JOB#: [id number]  cc: Jamekia Gannett P. Juliene PinaMody, MD, <Dictator> Janyth ContesSITAL P Devon Pretty MD ELECTRONICALLY SIGNED 02/06/2012 13:34

## 2015-03-06 ENCOUNTER — Other Ambulatory Visit
Admission: RE | Admit: 2015-03-06 | Discharge: 2015-03-06 | Disposition: A | Discharge status: Home / Self Care | Payer: Medicare Other | Source: Ambulatory Visit | Attending: *Deleted | Admitting: *Deleted

## 2015-03-06 DIAGNOSIS — Z0289 Encounter for other administrative examinations: Secondary | ICD-10-CM | POA: Insufficient documentation

## 2015-03-06 LAB — RAPID STREP SCREEN (MED CTR MEBANE ONLY): Streptococcus, Group A Screen (Direct): NEGATIVE

## 2015-03-09 LAB — CULTURE, GROUP A STREP (THRC)

## 2015-04-11 ENCOUNTER — Other Ambulatory Visit: Payer: Self-pay

## 2015-04-11 ENCOUNTER — Emergency Department

## 2015-04-11 ENCOUNTER — Emergency Department
Admission: EM | Admit: 2015-04-11 | Discharge: 2015-04-12 | Disposition: A | Discharge status: Home / Self Care | Attending: Emergency Medicine | Admitting: Emergency Medicine

## 2015-04-11 DIAGNOSIS — S0081XA Abrasion of other part of head, initial encounter: Secondary | ICD-10-CM | POA: Insufficient documentation

## 2015-04-11 DIAGNOSIS — F0390 Unspecified dementia without behavioral disturbance: Secondary | ICD-10-CM | POA: Diagnosis not present

## 2015-04-11 DIAGNOSIS — W07XXXA Fall from chair, initial encounter: Secondary | ICD-10-CM | POA: Diagnosis not present

## 2015-04-11 DIAGNOSIS — Y9289 Other specified places as the place of occurrence of the external cause: Secondary | ICD-10-CM | POA: Diagnosis not present

## 2015-04-11 DIAGNOSIS — Y998 Other external cause status: Secondary | ICD-10-CM | POA: Diagnosis not present

## 2015-04-11 DIAGNOSIS — Y9389 Activity, other specified: Secondary | ICD-10-CM | POA: Diagnosis not present

## 2015-04-11 DIAGNOSIS — N390 Urinary tract infection, site not specified: Secondary | ICD-10-CM | POA: Insufficient documentation

## 2015-04-11 DIAGNOSIS — S0990XA Unspecified injury of head, initial encounter: Secondary | ICD-10-CM | POA: Diagnosis present

## 2015-04-11 DIAGNOSIS — W19XXXA Unspecified fall, initial encounter: Secondary | ICD-10-CM

## 2015-04-11 HISTORY — DX: Hyperlipidemia, unspecified: E78.5

## 2015-04-11 HISTORY — DX: Dementia in other diseases classified elsewhere, unspecified severity, without behavioral disturbance, psychotic disturbance, mood disturbance, and anxiety: F02.80

## 2015-04-11 HISTORY — DX: Anxiety disorder, unspecified: F41.9

## 2015-04-11 HISTORY — DX: Essential (primary) hypertension: I10

## 2015-04-11 HISTORY — DX: Arthropathy, unspecified: M12.9

## 2015-04-11 HISTORY — DX: Unspecified sequelae of unspecified cerebrovascular disease: I69.90

## 2015-04-11 HISTORY — DX: Alzheimer's disease, unspecified: G30.9

## 2015-04-11 HISTORY — DX: Unspecified macular degeneration: H35.30

## 2015-04-11 HISTORY — DX: Major depressive disorder, single episode, severe without psychotic features: F32.2

## 2015-04-11 LAB — URINALYSIS COMPLETE WITH MICROSCOPIC (ARMC ONLY)
BACTERIA UA: NONE SEEN
Bilirubin Urine: NEGATIVE
Glucose, UA: NEGATIVE mg/dL
Hgb urine dipstick: NEGATIVE
NITRITE: NEGATIVE
PH: 5 (ref 5.0–8.0)
Protein, ur: 100 mg/dL — AB
SPECIFIC GRAVITY, URINE: 1.023 (ref 1.005–1.030)

## 2015-04-11 MED ORDER — SULFAMETHOXAZOLE-TRIMETHOPRIM 800-160 MG PO TABS: 1.0000 | ORAL_TABLET | Freq: Two times a day (BID) | ORAL | Status: AC

## 2015-04-11 NOTE — Discharge Instructions (Signed)
Please seek medical attention for any high fevers, chest pain, shortness of breath, change in behavior, persistent vomiting, bloody stool or any other new or concerning symptoms. ° °Urinary Tract Infection °Urinary tract infections (UTIs) can develop anywhere along your urinary tract. Your urinary tract is your body's drainage system for removing wastes and extra water. Your urinary tract includes two kidneys, two ureters, a bladder, and a urethra. Your kidneys are a pair of bean-shaped organs. Each kidney is about the size of your fist. They are located below your ribs, one on each side of your spine. °CAUSES °Infections are caused by microbes, which are microscopic organisms, including fungi, viruses, and bacteria. These organisms are so small that they can only be seen through a microscope. Bacteria are the microbes that most commonly cause UTIs. °SYMPTOMS  °Symptoms of UTIs may vary by age and gender of the patient and by the location of the infection. Symptoms in young women typically include a frequent and intense urge to urinate and a painful, burning feeling in the bladder or urethra during urination. Older women and men are more likely to be tired, shaky, and weak and have muscle aches and abdominal pain. A fever may mean the infection is in your kidneys. Other symptoms of a kidney infection include pain in your back or sides below the ribs, nausea, and vomiting. °DIAGNOSIS °To diagnose a UTI, your caregiver will ask you about your symptoms. Your caregiver also will ask to provide a urine sample. The urine sample will be tested for bacteria and white blood cells. White blood cells are made by your body to help fight infection. °TREATMENT  °Typically, UTIs can be treated with medication. Because most UTIs are caused by a bacterial infection, they usually can be treated with the use of antibiotics. The choice of antibiotic and length of treatment depend on your symptoms and the type of bacteria causing your  infection. °HOME CARE INSTRUCTIONS °· If you were prescribed antibiotics, take them exactly as your caregiver instructs you. Finish the medication even if you feel better after you have only taken some of the medication. °· Drink enough water and fluids to keep your urine clear or pale yellow. °· Avoid caffeine, tea, and carbonated beverages. They tend to irritate your bladder. °· Empty your bladder often. Avoid holding urine for long periods of time. °· Empty your bladder before and after sexual intercourse. °· After a bowel movement, women should cleanse from front to back. Use each tissue only once. °SEEK MEDICAL CARE IF:  °· You have back pain. °· You develop a fever. °· Your symptoms do not begin to resolve within 3 days. °SEEK IMMEDIATE MEDICAL CARE IF:  °· You have severe back pain or lower abdominal pain. °· You develop chills. °· You have nausea or vomiting. °· You have continued burning or discomfort with urination. °MAKE SURE YOU:  °· Understand these instructions. °· Will watch your condition. °· Will get help right away if you are not doing well or get worse. °Document Released: 05/21/2005 Document Revised: 02/10/2012 Document Reviewed: 09/19/2011 °Exit[employer]® Patient Information ©2015 [employer]. This information is not intended to replace advice given to you by your health care provider. Make sure you discuss any questions you have with your health care provider. ° °

## 2015-04-11 NOTE — ED Notes (Signed)
According to EMS, pt fell from a chair and made impact with the top of her head. [facility] staff stated to EMS, that pt was difficult to arouse immediately post fall. [facility] staff states that prior to fall pt had been administered her evening medications which they staff cause her to become "groggy". Pt arrives to ED alert with eyes closed, in no acute distress with redenned area on anterior scalp.

## 2015-04-11 NOTE — ED Provider Notes (Signed)
[facility] Emergency Department Provider Note    ____________________________________________  Time seen: 2120  I have reviewed the triage vital signs and the nursing notes.   HISTORY  Chief Complaint No chief complaint on file.   History limited by: dementia   HPI Erika Cochran is a 81 y.o. female who presents to the emergency department today after a fall. The patient apparently fell forward out of her wheelchair today. She did suffer a small abrasion to her forehead. The patient herself, unfortunately, cannot give any history.   No past medical history on file.  There are no active problems to display for this patient.   No past surgical history on file.  No current outpatient prescriptions on file.  Allergies Review of patient's allergies indicates not on file.  No family history on file.  Social History Social History  Substance Use Topics  . Smoking status: Unknown If Ever Smoked  . Smokeless tobacco: None  . Alcohol Use: No    Review of Systems Unable to obtain because of dementia  ____________________________________________   PHYSICAL EXAM:  VITAL SIGNS:   86  19  112/61 mmHg  92 %     Constitutional: Awake, talking. Pleasantly demented.  Eyes: Conjunctivae are normal. PERRL. Normal extraocular movements. ENT   Head: Normocephalic. Small bruise to forehead. No hemotympanum.   Nose: No congestion/rhinnorhea.   Mouth/Throat: Mucous membranes are moist.   Neck: No stridor. No midline tenderness.  Hematological/Lymphatic/Immunilogical: No cervical lymphadenopathy. Cardiovascular: Normal rate, regular rhythm.  No murmurs, rubs, or gallops. Respiratory: Normal respiratory effort without tachypnea nor retractions. Breath sounds are clear and equal bilaterally. No wheezes/rales/rhonchi. Gastrointestinal: Soft and nontender. No distention.  Genitourinary: Deferred Musculoskeletal: Normal range of motion in all  extremities. No joint effusions.  No lower extremity tenderness nor edema. Neurologic:  Normal speech and language. No gross focal neurologic deficits are appreciated. Speech is normal.  Skin:  Skin is warm, dry and intact. No rash noted. Psychiatric: Mood and affect are normal. Speech and behavior are normal. Patient exhibits appropriate insight and judgment.  ____________________________________________    LABS (pertinent positives/negatives)  Labs Reviewed  URINALYSIS COMPLETEWITH MICROSCOPIC ([facility] ONLY) - Abnormal; Notable for the following:    Color, Urine AMBER (*)    APPearance CLOUDY (*)    Ketones, ur TRACE (*)    Protein, ur 100 (*)    Leukocytes, UA 1+ (*)    Squamous Epithelial / LPF 0-5 (*)    All other components within normal limits     ____________________________________________   EKG  I, Phineas Semen, attending physician, personally viewed and interpreted this EKG  EKG Time: 2116 Rate: 80 Rhythm: sinus rhythm with PVC Axis: left axis deviation Intervals: qtc 431 QRS: narrow ST changes: no st elevation  ____________________________________________    RADIOLOGY  CT Head/C-spine IMPRESSION: 1. No evidence of traumatic intracranial injury or fracture. 2. No evidence of acute fracture or subluxation along the cervical spine. 3. Mild to moderate cortical volume loss and scattered small vessel ischemic microangiopathy. 4. Chronic infarct at the right occipital lobe with associated encephalomalacia. Chronic lacunar infarcts of the basal ganglia, more prominent on the left. 5. Mild diffuse degenerative change along the cervical spine. 6. Mild mucosal thickening at the left side of the sphenoid sinus.  ____________________________________________   PROCEDURES  Procedure(s) performed: None  Critical Care performed: No  ____________________________________________   INITIAL IMPRESSION / ASSESSMENT AND PLAN / ED COURSE  Pertinent labs &  imaging results that were  available during my care of the patient were reviewed by me and considered in my medical decision making (see chart for details).  Patient presented to the emergency department today after a fall forward out of her wheelchair. She did suffer an abrasion to the top of her head. CT head and C-spine without any acute findings. Urine was concerning for urinary tract infection. Did discuss the plan of care with granddaughter who is in the room. Will plan on discharging home with antibiotics.  ____________________________________________   FINAL CLINICAL IMPRESSION(S) / ED DIAGNOSES  Final diagnoses:  Fall, initial encounter  UTI (lower urinary tract infection)     Phineas Semen, MD 04/11/15 [phone]

## 2015-07-15 ENCOUNTER — Emergency Department
Admission: EM | Admit: 2015-07-15 | Discharge: 2015-07-15 | Disposition: A | Discharge status: Home / Self Care | Attending: Emergency Medicine | Admitting: Emergency Medicine

## 2015-07-15 ENCOUNTER — Emergency Department

## 2015-07-15 DIAGNOSIS — G309 Alzheimer's disease, unspecified: Secondary | ICD-10-CM | POA: Diagnosis not present

## 2015-07-15 DIAGNOSIS — J159 Unspecified bacterial pneumonia: Secondary | ICD-10-CM | POA: Diagnosis not present

## 2015-07-15 DIAGNOSIS — R1310 Dysphagia, unspecified: Secondary | ICD-10-CM | POA: Diagnosis not present

## 2015-07-15 DIAGNOSIS — Z79899 Other long term (current) drug therapy: Secondary | ICD-10-CM | POA: Diagnosis not present

## 2015-07-15 DIAGNOSIS — F0280 Dementia in other diseases classified elsewhere without behavioral disturbance: Secondary | ICD-10-CM | POA: Diagnosis not present

## 2015-07-15 DIAGNOSIS — N390 Urinary tract infection, site not specified: Secondary | ICD-10-CM | POA: Diagnosis not present

## 2015-07-15 DIAGNOSIS — Z880 Allergy status to penicillin: Secondary | ICD-10-CM | POA: Insufficient documentation

## 2015-07-15 DIAGNOSIS — Z7982 Long term (current) use of aspirin: Secondary | ICD-10-CM | POA: Diagnosis not present

## 2015-07-15 DIAGNOSIS — I10 Essential (primary) hypertension: Secondary | ICD-10-CM | POA: Diagnosis not present

## 2015-07-15 DIAGNOSIS — Z792 Long term (current) use of antibiotics: Secondary | ICD-10-CM | POA: Diagnosis not present

## 2015-07-15 DIAGNOSIS — J189 Pneumonia, unspecified organism: Secondary | ICD-10-CM

## 2015-07-15 LAB — CBC
HCT: 35.7 % (ref 35.0–47.0)
Hemoglobin: 12.2 g/dL (ref 12.0–16.0)
MCH: 34.3 pg — AB (ref 26.0–34.0)
MCHC: 34.2 g/dL (ref 32.0–36.0)
MCV: 100.4 fL — AB (ref 80.0–100.0)
PLATELETS: 169 10*3/uL (ref 150–440)
RBC: 3.55 MIL/uL — ABNORMAL LOW (ref 3.80–5.20)
RDW: 13.2 % (ref 11.5–14.5)
WBC: 3.9 10*3/uL (ref 3.6–11.0)

## 2015-07-15 LAB — COMPREHENSIVE METABOLIC PANEL
ALK PHOS: 68 U/L (ref 38–126)
ALT: 12 U/L — AB (ref 14–54)
ANION GAP: 5 (ref 5–15)
AST: 18 U/L (ref 15–41)
Albumin: 3.5 g/dL (ref 3.5–5.0)
BUN: 11 mg/dL (ref 6–20)
CALCIUM: 9.3 mg/dL (ref 8.9–10.3)
CO2: 31 mmol/L (ref 22–32)
CREATININE: 0.67 mg/dL (ref 0.44–1.00)
Chloride: 107 mmol/L (ref 101–111)
Glucose, Bld: 92 mg/dL (ref 65–99)
Potassium: 3.6 mmol/L (ref 3.5–5.1)
SODIUM: 143 mmol/L (ref 135–145)
Total Bilirubin: 0.6 mg/dL (ref 0.3–1.2)
Total Protein: 5.8 g/dL — ABNORMAL LOW (ref 6.5–8.1)

## 2015-07-15 LAB — URINALYSIS COMPLETE WITH MICROSCOPIC (ARMC ONLY)
BACTERIA UA: NONE SEEN
Bilirubin Urine: NEGATIVE
Glucose, UA: NEGATIVE mg/dL
Hgb urine dipstick: NEGATIVE
KETONES UR: NEGATIVE mg/dL
NITRITE: NEGATIVE
PH: 7 (ref 5.0–8.0)
PROTEIN: NEGATIVE mg/dL
SPECIFIC GRAVITY, URINE: 1.018 (ref 1.005–1.030)
TRANS EPITHEL UA: 1

## 2015-07-15 LAB — TROPONIN I

## 2015-07-15 MED ORDER — LEVOFLOXACIN 750 MG PO TABS: 750.0000 mg | ORAL_TABLET | Freq: Every day | ORAL | Status: AC

## 2015-07-15 MED ORDER — SODIUM CHLORIDE 0.9 % IV BOLUS (SEPSIS)
1000.0000 mL | Freq: Once | INTRAVENOUS | Status: AC
  Administered 2015-07-15: 1000 mL via INTRAVENOUS

## 2015-07-15 MED ORDER — LEVOFLOXACIN IN D5W 750 MG/150ML IV SOLN
750.0000 mg | Freq: Once | INTRAVENOUS | Status: AC
  Administered 2015-07-15: 750 mg via INTRAVENOUS
  Filled 2015-07-15: qty 150

## 2015-07-15 NOTE — ED Provider Notes (Signed)
[facility] Emergency Department Provider Note REMINDER - THIS NOTE IS NOT A FINAL MEDICAL RECORD UNTIL IT IS SIGNED. UNTIL THEN, THE CONTENT BELOW MAY REFLECT INFORMATION FROM A DOCUMENTATION TEMPLATE, NOT THE ACTUAL PATIENT VISIT. ____________________________________________  Time seen: Approximately 2:22 PM  I have reviewed the triage vital signs and the nursing notes.   HISTORY  Chief Complaint Dysphagia  History physical and review of systems are limited because of patient's severe dementia  HPI Erika Cochran is a 81 y.o. female  presents for evaluation of poor intake over the last week. Per the patient's family she began eating less about one week ago, she had a couple days where her speech seemed off but is better now. She is continue deep poorly and for the last 2 days has not been able to take anything by mouth. They reported that sometimes she'll put things in her mouth and spit them out. She has not had any episodes choking, no fever they're aware of. Reported in the past she's had infection like urinary tract infection and had similar symptoms.  She is currently on hospice care, and her power of attorney/granddaughter is here. She advises that they wish for evaluation of the cause of her symptoms today.  The patient herself is unable to give any history and has severe dementia.   Past Medical History  Diagnosis Date  . Alzheimer's dementia   . Hypertension   . Severe major depression (HCC)   . Anxiety disorder   . Hyperlipidemia   . Macular degeneration   . Late effects of CVA (cerebrovascular accident)   . Arthropathies     There are no active problems to display for this patient.   History reviewed. No pertinent past surgical history.  Current Outpatient Rx  Name  Route  Sig  Dispense  Refill  . albuterol (PROVENTIL HFA;VENTOLIN HFA) 108 (90 BASE) MCG/ACT inhaler   Inhalation   Inhale 2 puffs into the lungs 2 (two) times daily as  needed (for cough).         . citalopram (CELEXA) 10 MG tablet   Oral   Take 10 mg by mouth daily.         Marland Kitchen dipyridamole-aspirin (AGGRENOX) 200-25 MG per 12 hr capsule   Oral   Take 1 capsule by mouth 2 (two) times daily.         Marland Kitchen levofloxacin (LEVAQUIN) 750 MG tablet   Oral   Take 1 tablet (750 mg total) by mouth daily.   5 tablet   0   . LORazepam (ATIVAN) 0.5 MG tablet   Oral   Take 0.5 mg by mouth every 6 (six) hours as needed for anxiety.         . Melatonin 3 MG TABS   Oral   Take 3 mg by mouth at bedtime.         . memantine (NAMENDA) 10 MG tablet   Oral   Take 10 mg by mouth 2 (two) times daily.         . mirtazapine (REMERON) 15 MG tablet   Oral   Take 15 mg by mouth at bedtime.         Marland Kitchen omeprazole (PRILOSEC) 20 MG capsule   Oral   Take 20 mg by mouth 2 (two) times daily.         . QUEtiapine (SEROQUEL) 25 MG tablet   Oral   Take 12.5-75 mg by mouth 2 (two) times daily. Pt takes  12.5mg  at noon and 75mg  at bedtime.         . senna-docusate (SENOKOT-S) 8.6-50 MG per tablet   Oral   Take 1 tablet by mouth 2 (two) times daily.         . Skin Protectants, Misc. (BAZA PROTECT EX)   Apply externally   Apply 1 application topically as needed (for skin irritation/redness).         Marland Kitchen sulfamethoxazole-trimethoprim (BACTRIM DS,SEPTRA DS) 800-160 MG per tablet   Oral   Take 1 tablet by mouth 2 (two) times daily.   10 tablet   0     Allergies Penicillins  No family history on file.  Social History Social History  Substance Use Topics  . Smoking status: Unknown If Ever Smoked  . Smokeless tobacco: None  . Alcohol Use: No    Review of Systems Constitutional: No fever/chills ENT: No sore throat. The question if she may have some trouble swallowing due to her throat area exposed thickened liquids at baseline and already has trouble swallowing after previous stroke. Cardiovascular: Denies chest pain. Respiratory: Denies shortness  of breath. Gastrointestinal: No abdominal pain.  No nausea, no vomiting.  She is not appeared to be in abdominal pain. Genitourinary: Unknown Musculoskeletal: Seemingly more weak over the last few days though no one spot or arm or leg seems to be weak Skin: Negative for rash. Neurological: Negative for headaches, focal weakness. did have some slurred speech though this seems to be slightly better. They've not noticed weakness in any one arm or leg. No facial droop.  10-point ROS otherwise negative.  ____________________________________________   PHYSICAL EXAM:  VITAL SIGNS: ED Triage Vitals  Enc Vitals Group     BP 07/15/15 1324 154/94 mmHg     Pulse Rate 07/15/15 1324 73     Resp 07/15/15 1324 14     Temp 07/15/15 1345 97.2 F (36.2 C)     Temp Source 07/15/15 1345 Axillary     SpO2 07/15/15 1324 98 %     Weight 07/15/15 1324 120 lb (54.432 kg)     Height 07/15/15 1324 5\' 6"  (1.676 m)     Head Cir --      Peak Flow --      Pain Score --      Pain Loc --      Pain Edu? --      Excl. in GC? --    Constitutional: Alert and oriented. Chronically ill appearing but in no distress. Eyes: Conjunctivae are normal. PERRL. EOMI. Head: Atraumatic. Nose: No congestion/rhinnorhea. Mouth/Throat: Mucous membranes are quite dry.  Oropharynx non-erythematous. Neck: No stridor.  No anterior neck tenderness. No anterior neck swelling. Cardiovascular: Normal rate, regular rhythm. Grossly normal heart sounds.  Good peripheral circulation. Respiratory: Normal respiratory effort.  No retractions. Lungs CTAB. Gastrointestinal: Soft and nontender. No distention.  Musculoskeletal: No lower extremity tenderness nor edema.  No joint effusions. Neurologic:  Much thick speech, there are family reports this is her normal speech. She is able to swallow her secretions, she is not spitting up. No gross focal neurologic deficits are appreciated though she is weak in both legs, questionably slightly worse in  the right though it is somewhat hard to tell as the patient's effort is also slightly poor. She is a walker baseline per the family. Skin:  Skin is warm, dry and intact. No rash noted. Psychiatric: Mood and affect are flat and calm. ____________________________________________   LABS (all labs ordered are listed,  but only abnormal results are displayed)  Labs Reviewed  CBC - Abnormal; Notable for the following:    RBC 3.55 (*)    MCV 100.4 (*)    MCH 34.3 (*)    All other components within normal limits  COMPREHENSIVE METABOLIC PANEL - Abnormal; Notable for the following:    Total Protein 5.8 (*)    ALT 12 (*)    All other components within normal limits  URINALYSIS COMPLETEWITH MICROSCOPIC ([facility] ONLY) - Abnormal; Notable for the following:    Color, Urine YELLOW (*)    APPearance CLOUDY (*)    Leukocytes, UA 2+ (*)    Squamous Epithelial / LPF 6-30 (*)    All other components within normal limits  URINE CULTURE  TROPONIN I   ____________________________________________  EKG  Reviewed and interpreted by me Normal sinus rhythm heart rate 75 Frequent PVCs No significant ST abnormalities PR interval 200 QT C for 16 Normal sinus rhythm, frequent PVCs, slight artifact but overall no significant acute abnormality, minimal Q waves noted inferior ____________________________________________  RADIOLOGY  CT Head Wo Contrast (Final result) Result time: 07/15/15 15:32:40   Final result by Rad Results In Interface (07/15/15 15:32:40)   Narrative:   CLINICAL DATA: Worsening dysphagia. Dementia .  EXAM: CT HEAD WITHOUT CONTRAST  TECHNIQUE: Contiguous axial images were obtained from the base of the skull through the vertex without intravenous contrast.  COMPARISON: 04/11/2015 head CT.  FINDINGS: No evidence of parenchymal hemorrhage or extra-axial fluid collection. No mass lesion, mass effect, or midline shift.  No CT evidence of acute infarction. There is  intracranial atherosclerosis. There are stable old lacunar infarcts in the bilateral basal ganglia. There is a stable large region of encephalomalacia in the right parieto-occipital lobe. Nonspecific stable subcortical and periventricular white matter hypodensity, most in keeping with chronic small vessel ischemic change.  There is stable prominent diffuse cerebral volume loss. Cerebral ventricle sizes are stable and concordant with the degree of cerebral volume loss.  The visualized paranasal sinuses are essentially clear. The mastoid air cells are unopacified. No evidence of calvarial fracture.  IMPRESSION: 1. No evidence of acute intracranial abnormality. 2. Stable large region of encephalomalacia in the right parieto-occipital lobe. Stable old lacunar infarcts in the bilateral basal ganglia. Intracranial atherosclerosis, prominent diffuse cerebral volume loss and chronic small vessel ischemic white matter change.   Electronically Signed By: Delbert Phenix M.D. On: 07/15/2015 15:32          CT Soft Tissue Neck Wo Contrast (Final result) Result time: 07/15/15 15:27:50   Final result by Rad Results In Interface (07/15/15 15:27:50)   Narrative:   CLINICAL DATA: 81 year old female with dysphagia.  EXAM: CT NECK WITHOUT CONTRAST  TECHNIQUE: Multidetector CT imaging of the neck was performed following the standard protocol without intravenous contrast.  COMPARISON: 04/10/2014 cervical spine CT.  FINDINGS: Pharynx and larynx: The pharyngeal mucosal soft tissues appear symmetric and within normal limits, with no focal mass visualized in the nasopharynx, oropharynx or hypopharynx. No retropharyngeal or peritonsillar fluid collections. Epiglottis is within normal limits. No foreign body is seen in the pharynx or visualized upper esophagus. No laryngeal mass.  Salivary glands: Within normal limits.  Thyroid: Within normal limits.  Lymph nodes: No  pathologically enlarged cervical lymph nodes.  Vascular: Atherosclerotic calcifications in the visualized aortic arch and extracranial carotid arteries.  Limited intracranial: No acute abnormality. Intracranial atherosclerosis is noted.  Mastoids and visualized paranasal sinuses: Non-opacified .  Skeleton: There is reversal of the  normal cervical lordosis, unchanged. No acute fracture or subluxation is seen in the cervical spine. There is moderate to severe degenerative disc disease in the mid to lower cervical spine, most prominent at C5-6 and C6-7, unchanged. There is stable minimal 2 mm anterolisthesis at C3-4 and C4-5. There is severe facet arthropathy and ankylosis bilaterally in the cervical spine.  Upper chest: Mild pleural-parenchymal scarring at both lung apices.  IMPRESSION: 1. No evidence of pharyngeal, upper airway or upper esophageal mass or foreign body. No retropharyngeal or peritonsillar fluid collections. 2. Stable severe degenerative changes in the cervical spine as described.   Electronically Signed By: Delbert Phenix M.D. On: 07/15/2015 15:27          DG Chest Port 1 View (Final result) Result time: 07/15/15 14:57:17   Final result by Rad Results In Interface (07/15/15 14:57:17)   Narrative:   CLINICAL DATA: Dysphagia.  EXAM: PORTABLE CHEST 1 VIEW  COMPARISON: 01/29/2014 chest radiograph.  FINDINGS: Stable cardiomediastinal silhouette with normal heart size. No pneumothorax. No pleural effusion. No pulmonary edema. There is mild patchy opacity at the left costophrenic angle.  IMPRESSION: Mild patchy opacity at the left costophrenic angle, which could represent mild aspiration or developing pneumonia. Consider follow-up PA and lateral post treatment chest radiographs in 6-8 weeks, as clinically warranted.   Electronically Signed By: Delbert Phenix M.D. On: 07/15/2015 14:57     ____________________________________________   PROCEDURES  Procedure(s) performed: None  Critical Care performed: No  ____________________________________________   INITIAL IMPRESSION / ASSESSMENT AND PLAN / ED COURSE  Pertinent labs & imaging results that were available during my care of the patient were reviewed by me and considered in my medical decision making (see chart for details).  Patient presents for evaluation of appears to be slow worsening of oral intake over the last week. Now to the point that she is been mildly anything for last 1 week. There is no obvious neurologic deficit, though she is questionably some slight weakness in the right leg greater than the left. She's afebrile and in no distress. Patient has dementia which limits evaluation some. Overall she is hemodynamically stable, based on her concern I am concerned about her poor oral intake. I discussed with her healthcare power of attorney who indicated that the goal of care today's to evaluate and see if there is something that can be easily fixed, overall preferable to return back to her nursing care facility and that the patient would not want to have any sort of feeding tube placed.  At this point we'll do a broad workup as it's unclear exactly what going on with the patient is not able to eat. We will do a CT of the head for any evidence of an acute stroke, CT of the neck to evaluate for mass that may be inhibiting swallowing, in addition we will obtain urinalysis, basic labs and a chest x-ray as we evaluate for etiology. ____________________________________________   ----------------------------------------- 4:05 PM on 07/15/2015 -----------------------------------------  Patient's labs indicate probable urinary tract infection with leukocytes and multiple white cells. No bacteria seen, however the family reports the patient has had similar presentations with urinary tract infections in the past. In  addition there is a questionable pneumonia. We'll treat the patient here with a dose of Levaquin and urine culture.  Patient's goal of care is primarily comfort, but treatment for this urinary tract infection is approved by her family and POA. The patient would never want a feeding tube, and even if  she does have slight aspiration, she is already on a modified thickened diet per family/POA. Discussed with the family, given the patient's hospice status, primary desire for comfort but also treatment for antibiotics and desire to be able return to her living facility that she is currently at I will discharge the patient home with prescription for Levaquin. Her renal function does not indicate any acute kidney injury despite poor oral intake, though she will take ensure shakes per the family. They will continue to encourage high protein shakes, and thickened liquids which is recurrent diet. He'll return to the emergency room if worsening of her condition, high fevers, vomiting, or other new concerns arise.  I discussed with the family that there is certainly a possibility that she may continue to worsen, and the family is accepting of this understanding of her hospice care. They will continue to follow closely the hospice service.   FINAL CLINICAL IMPRESSION(S) / ED DIAGNOSES  Final diagnoses:  Dysphagia  Acute urinary tract infection  Community acquired pneumonia      Sharyn CreamerMark Quale, MD 07/15/15 1629

## 2015-07-15 NOTE — ED Notes (Signed)
IV ABX are completed along with bolus NS. Awaiting EMS transport to assisted living. Family at bedside.

## 2015-07-15 NOTE — ED Notes (Signed)
MD at bedside. 

## 2015-07-15 NOTE — ED Notes (Signed)
[facility] with cc of dysphagia. Pt hx of dysphagia, uses thickened liquids at this time. Staff have noticed progressive worsening of dysphagia. Pt unable to swallow food, liquids, medications, etc. No sudden change in mentality. Pt soft spoken, uncooperative but calm with care with EMS

## 2015-07-15 NOTE — ED Notes (Signed)
EMS here to transport to assisted living. Patient changed and loaded onto stretcher. Family here to assist.

## 2015-07-15 NOTE — ED Notes (Signed)
Family at bedside. Family reports that they noticed patient had slurred speech last Saturday. Pt hx of syncopal episodes; family assumed that pt slurred speech was from syncopal episodes. Pt family states that they slurred speech never went away and that today when they visited patient, they noticed that pt was having a hard time swallowing. States that it appeared as if patient had sore throat and would not swallow food or drink. Pt confused, but at baseline mentality. Pt follows granddaughter's instructions and responds appropriately to granddaughter. Pt states "yes" when asked if her throat hurts by her granddaughter.

## 2015-07-15 NOTE — ED Notes (Signed)
Xray at bedside. Pt now to CT

## 2015-07-15 NOTE — Discharge Instructions (Signed)
Urinary Tract Infection Urinary tract infections (UTIs) can develop anywhere along your urinary tract. Your urinary tract is your body's drainage system for removing wastes and extra water. Your urinary tract includes two kidneys, two ureters, a bladder, and a urethra. Your kidneys are a pair of bean-shaped organs. Each kidney is about the size of your fist. They are located below your ribs, one on each side of your spine. CAUSES Infections are caused by microbes, which are microscopic organisms, including fungi, viruses, and bacteria. These organisms are so small that they can only be seen through a microscope. Bacteria are the microbes that most commonly cause UTIs. SYMPTOMS  Symptoms of UTIs may vary by age and gender of the patient and by the location of the infection. Symptoms in young women typically include a frequent and intense urge to urinate and a painful, burning feeling in the bladder or urethra during urination. Older women and men are more likely to be tired, shaky, and weak and have muscle aches and abdominal pain. A fever may mean the infection is in your kidneys. Other symptoms of a kidney infection include pain in your back or sides below the ribs, nausea, and vomiting. DIAGNOSIS To diagnose a UTI, your caregiver will ask you about your symptoms. Your caregiver will also ask you to provide a urine sample. The urine sample will be tested for bacteria and white blood cells. White blood cells are made by your body to help fight infection. TREATMENT  Typically, UTIs can be treated with medication. Because most UTIs are caused by a bacterial infection, they usually can be treated with the use of antibiotics. The choice of antibiotic and length of treatment depend on your symptoms and the type of bacteria causing your infection. HOME CARE INSTRUCTIONS  If you were prescribed antibiotics, take them exactly as your caregiver instructs you. Finish the medication even if you feel better after  you have only taken some of the medication.  Drink enough water and fluids to keep your urine clear or pale yellow.  Avoid caffeine, tea, and carbonated beverages. They tend to irritate your bladder.  Empty your bladder often. Avoid holding urine for long periods of time.  Empty your bladder before and after sexual intercourse.  After a bowel movement, women should cleanse from front to back. Use each tissue only once. SEEK MEDICAL CARE IF:   You have back pain.  You develop a fever.  Your symptoms do not begin to resolve within 3 days. SEEK IMMEDIATE MEDICAL CARE IF:   You have severe back pain or lower abdominal pain.  You develop chills.  You have nausea or vomiting.  You have continued burning or discomfort with urination. MAKE SURE YOU:   Understand these instructions.  Will watch your condition.  Will get help right away if you are not doing well or get worse.   This information is not intended to replace advice given to you by your health care provider. Make sure you discuss any questions you have with your health care provider.   Document Released: 05/21/2005 Document Revised: 05/02/2015 Document Reviewed: 09/19/2011 Elsevier Interactive Patient Education 2016 [employer]  Community-Acquired Pneumonia, Adult Pneumonia is an infection of the lungs. One type of pneumonia can happen while a person is in a hospital. A different type can happen when a person is not in a hospital (community-acquired pneumonia). It is easy for this kind to spread from person to person. It can spread to you if you breathe near an  infected person who coughs or sneezes. Some symptoms include:  A dry cough.  A wet (productive) cough.  Fever.  Sweating.  Chest pain. HOME CARE  Take over-the-counter and prescription medicines only as told by your doctor.  Only take cough medicine if you are losing sleep.  If you were prescribed an antibiotic medicine, take it as told by your  doctor. Do not stop taking the antibiotic even if you start to feel better.  Sleep with your head and neck raised (elevated). You can do this by putting a few pillows under your head, or you can sleep in a recliner.  Do not use tobacco products. These include cigarettes, chewing tobacco, and e-cigarettes. If you need help quitting, ask your doctor.  Drink enough water to keep your pee (urine) clear or pale yellow. A shot (vaccine) can help prevent pneumonia. Shots are often suggested for:  People older than 79 years of age.  People older than 79 years of age:  Who are having cancer treatment.  Who have long-term (chronic) lung disease.  Who have problems with their body's defense system (immune system). You may also prevent pneumonia if you take these actions:  Get the flu (influenza) shot every year.  Go to the dentist as often as told.  Wash your hands often. If soap and water are not available, use hand sanitizer. GET HELP IF:  You have a fever.  You lose sleep because your cough medicine does not help. GET HELP RIGHT AWAY IF:  You are short of breath and it gets worse.  You have more chest pain.  Your sickness gets worse. This is very serious if:  You are an older adult.  Your body's defense system is weak.  You cough up blood.   This information is not intended to replace advice given to you by your health care provider. Make sure you discuss any questions you have with your health care provider.   Document Released: 01/28/2008 Document Revised: 05/02/2015 Document Reviewed: 12/06/2014 Elsevier Interactive Patient Education [employer]

## 2015-07-17 LAB — URINE CULTURE

## 2016-02-23 DEATH — deceased

## 2017-08-16 IMAGING — CT CT HEAD W/O CM
4 of 5 series · 15 of 34 positions shown, 17 images · non-contrast
Comparison: 04/11/2015 head CT.

CLINICAL DATA: Worsening dysphagia.  Dementia .

EXAM:
CT HEAD WITHOUT CONTRAST
TECHNIQUE: Contiguous axial images were obtained from the base of the skull
through the vertex without intravenous contrast.

[Series 2: axial neck · axial · 0.53mm/px · z∈[-284,-190]mm · 3 of 94 slices shown]
[im 24/94  bone]
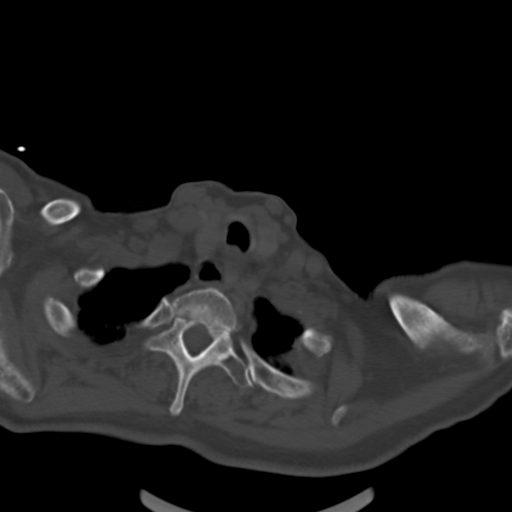
[im 47/94  bone]
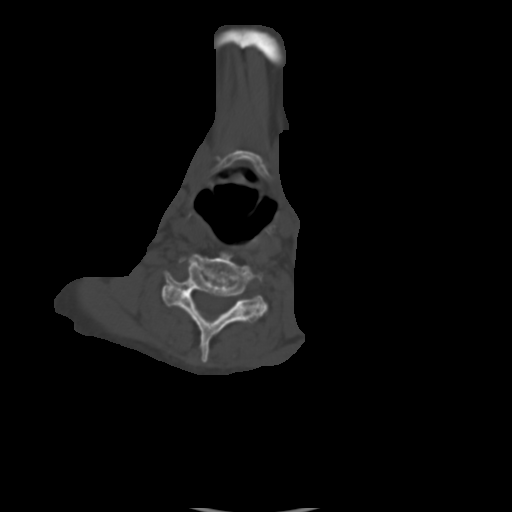
[im 70/94  bone]
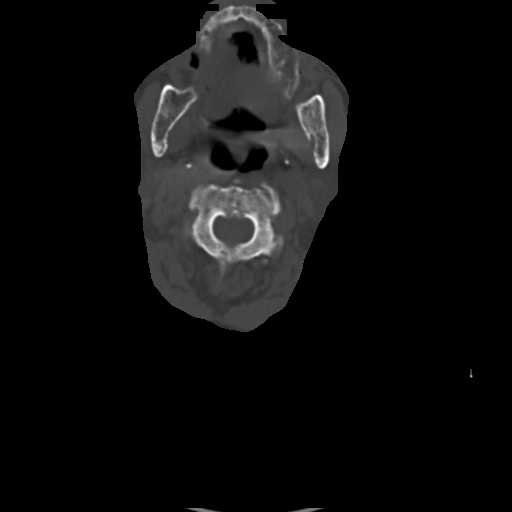

[Series 3: sag neck · sagittal · 0.45mm/px · 5 of 113 slices shown, 6 images]
[im 38/113  bone]
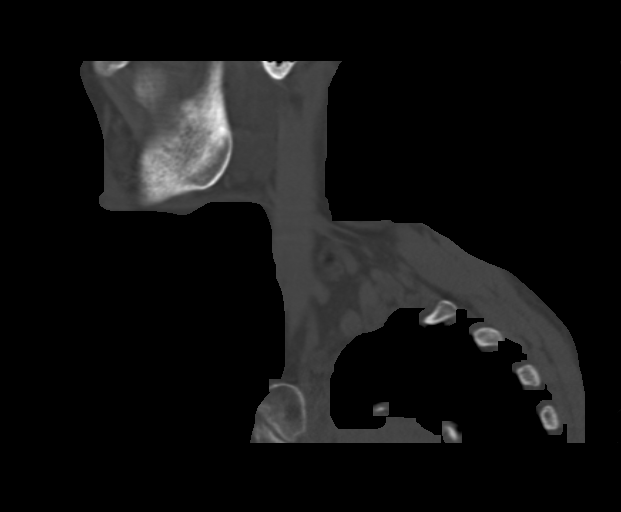
[im 47/113  bone]
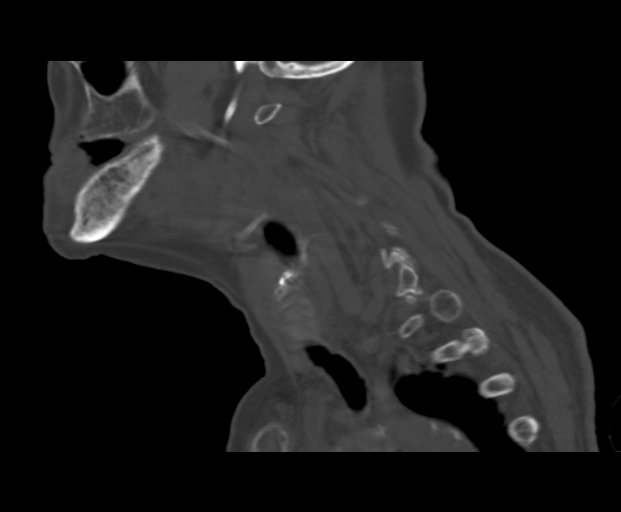
[im 57/113  soft-tissue]
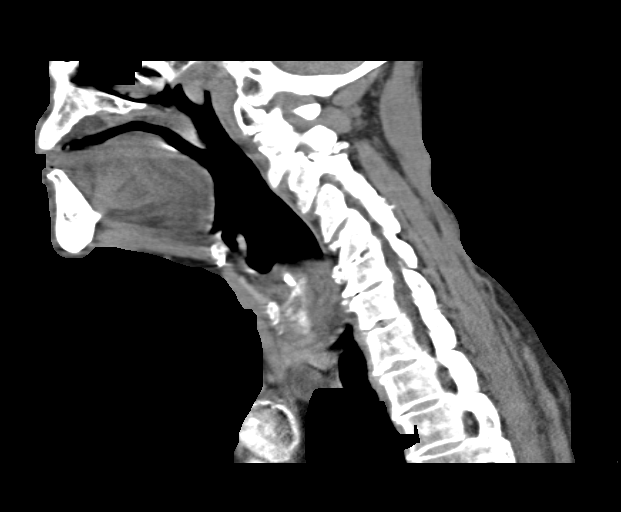
[im 57/113  bone]
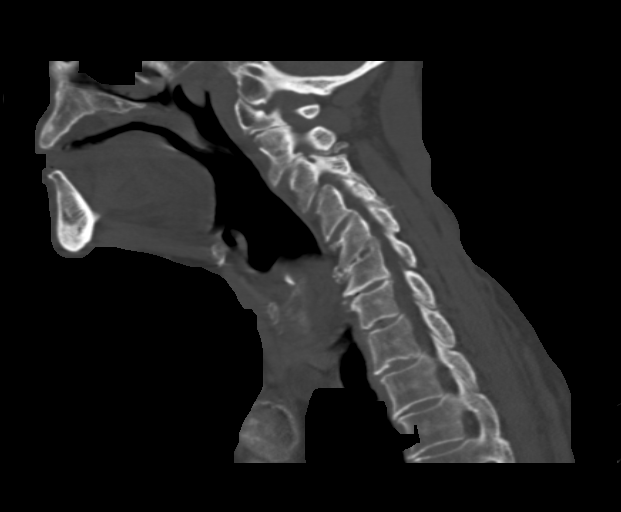
[im 66/113  bone]
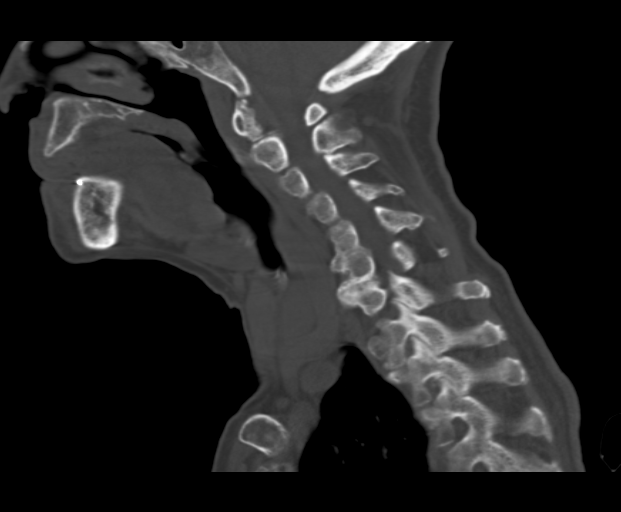
[im 75/113  bone]
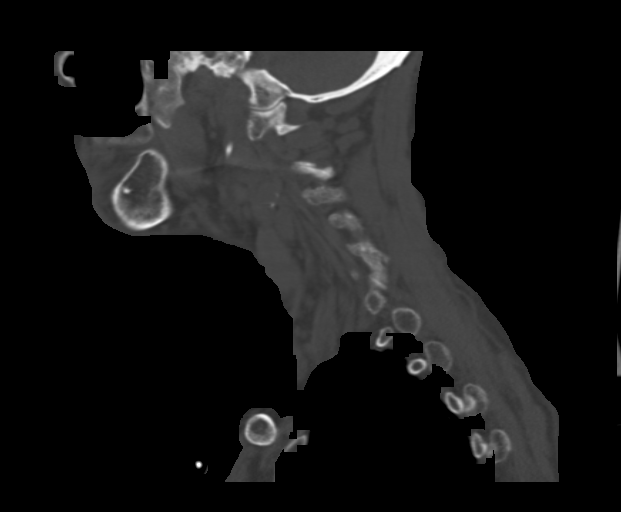

[Series 4: cor neck · coronal · 0.45mm/px · 3 of 117 slices shown]
[im 24/117  bone]
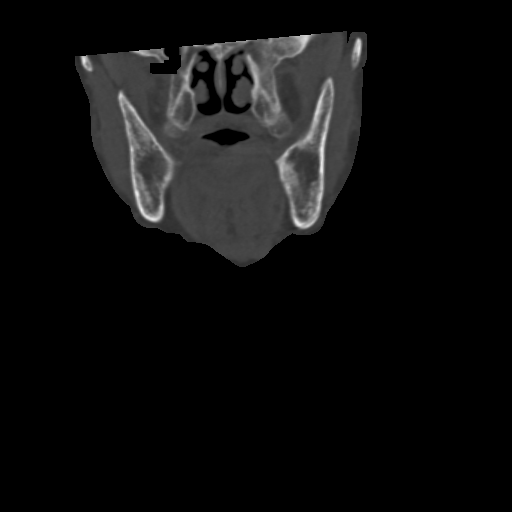
[im 47/117  bone]
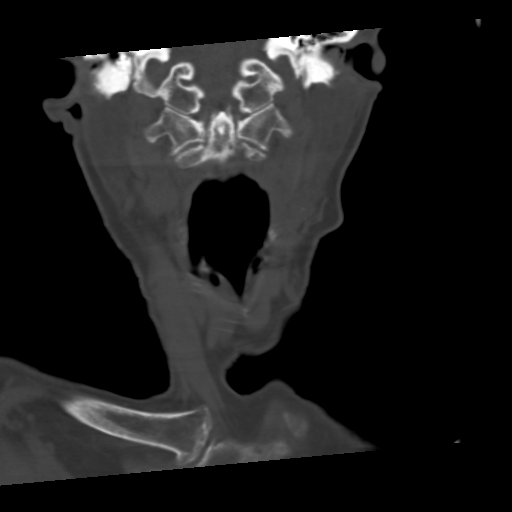
[im 70/117  bone]
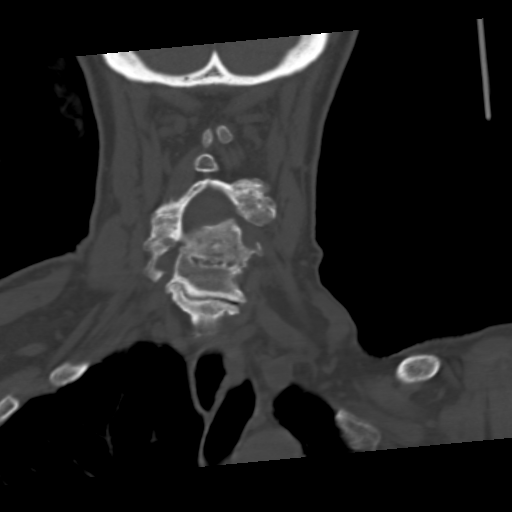

[Series 5: ax oropharynx · axial · 0.50mm/px · z∈[-293,-181]mm · 4 of 94 slices shown, 5 images]
[im 19/94  soft-tissue]
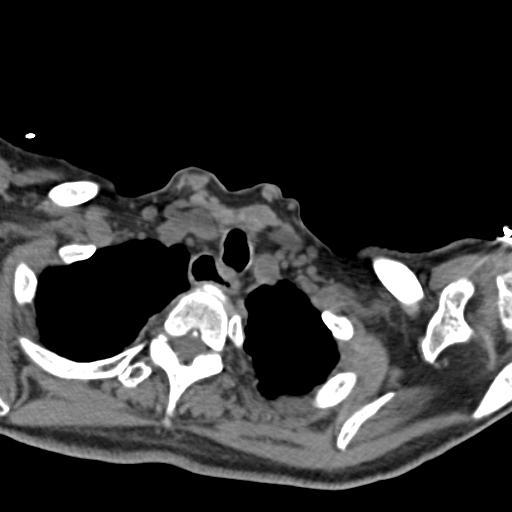
[im 19/94  bone]
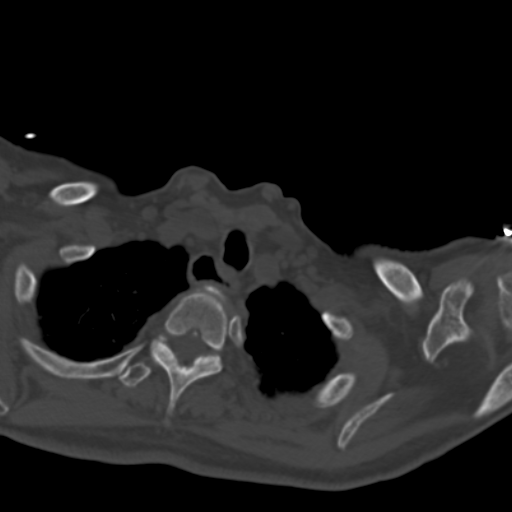
[im 38/94  bone]
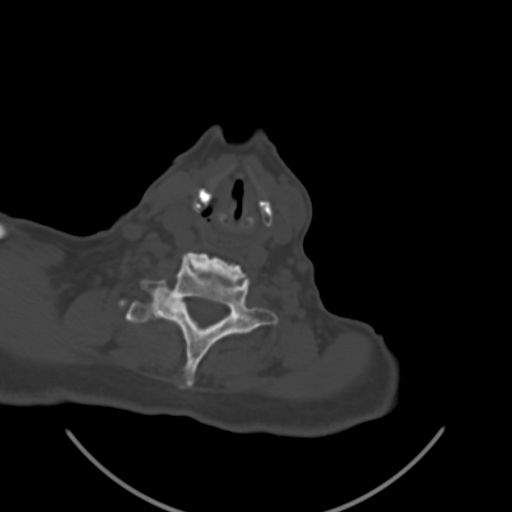
[im 56/94  bone]
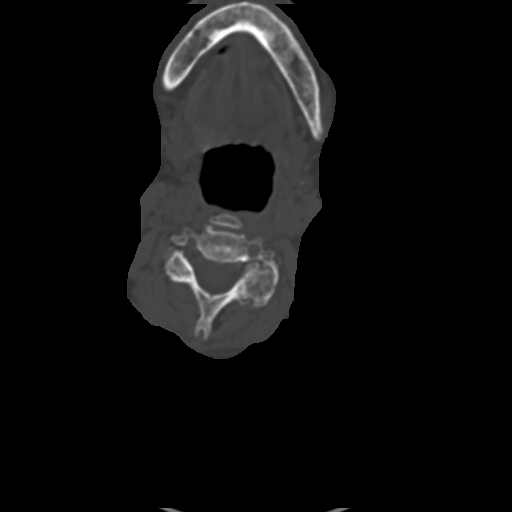
[im 75/94  bone]
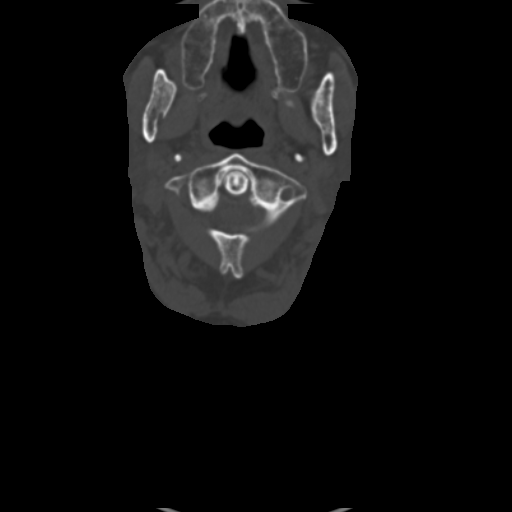

[15 of 34 positions shown; findings below may reference images not displayed]

FINDINGS: No evidence of parenchymal hemorrhage or extra-axial fluid
collection. No mass lesion, mass effect, or midline shift.

No CT evidence of acute infarction. There is intracranial
atherosclerosis. There are stable old lacunar infarcts in the
bilateral basal ganglia. There is a stable large region of
encephalomalacia in the right parieto-occipital lobe. Nonspecific
stable subcortical and periventricular white matter hypodensity,
most in keeping with chronic small vessel ischemic change.

There is stable prominent diffuse cerebral volume loss. Cerebral
ventricle sizes are stable and concordant with the degree of
cerebral volume loss.

The visualized paranasal sinuses are essentially clear. The mastoid
air cells are unopacified. No evidence of calvarial fracture.
IMPRESSION: 1.  No evidence of acute intracranial abnormality.
2. Stable large region of encephalomalacia in the right
parieto-occipital lobe. Stable old lacunar infarcts in the bilateral
basal ganglia. Intracranial atherosclerosis, prominent diffuse
cerebral volume loss and chronic small vessel ischemic white matter
change.
